# Patient Record
Sex: Male | Born: 1970 | Race: Black or African American | Hispanic: No | Marital: Married | Smoking: Never smoker
Health system: Southern US, Community
[De-identification: ages and names within clinical notes are randomized; demographics above are authoritative.]

## PROBLEM LIST (undated history)

## (undated) ENCOUNTER — Ambulatory Visit (HOSPITAL_COMMUNITY): Admission: EM | Payer: BLUE CROSS/BLUE SHIELD

## (undated) DIAGNOSIS — S2249XA Multiple fractures of ribs, unspecified side, initial encounter for closed fracture: Secondary | ICD-10-CM

## (undated) DIAGNOSIS — T7840XA Allergy, unspecified, initial encounter: Secondary | ICD-10-CM

## (undated) HISTORY — PX: ORTHOPEDIC SURGERY: SHX850

## (undated) HISTORY — DX: Multiple fractures of ribs, unspecified side, initial encounter for closed fracture: S22.49XA

## (undated) HISTORY — DX: Allergy, unspecified, initial encounter: T78.40XA

## (undated) HISTORY — PX: KNEE SURGERY: SHX244

## (undated) HISTORY — PX: OTHER SURGICAL HISTORY: SHX169

---

## 1998-08-15 ENCOUNTER — Emergency Department (HOSPITAL_COMMUNITY): Admission: EM | Admit: 1998-08-15 | Discharge: 1998-08-15 | Payer: Self-pay | Admitting: Emergency Medicine

## 2004-04-12 ENCOUNTER — Emergency Department (HOSPITAL_COMMUNITY): Admission: EM | Admit: 2004-04-12 | Discharge: 2004-04-12 | Payer: Self-pay | Admitting: Family Medicine

## 2006-07-19 ENCOUNTER — Emergency Department (HOSPITAL_COMMUNITY): Admission: EM | Admit: 2006-07-19 | Discharge: 2006-07-19 | Payer: Self-pay | Admitting: Emergency Medicine

## 2006-07-20 ENCOUNTER — Ambulatory Visit (HOSPITAL_COMMUNITY): Admission: RE | Admit: 2006-07-20 | Discharge: 2006-07-20 | Payer: Self-pay | Admitting: Otolaryngology

## 2007-12-26 ENCOUNTER — Emergency Department (HOSPITAL_COMMUNITY): Admission: EM | Admit: 2007-12-26 | Discharge: 2007-12-26 | Payer: Self-pay | Admitting: Emergency Medicine

## 2008-05-18 ENCOUNTER — Emergency Department (HOSPITAL_COMMUNITY): Admission: EM | Admit: 2008-05-18 | Discharge: 2008-05-18 | Payer: Self-pay | Admitting: Emergency Medicine

## 2010-08-10 LAB — BASIC METABOLIC PANEL
Calcium: 8.6 mg/dL (ref 8.4–10.5)
GFR calc Af Amer: >60 mL/min (ref >60)
GFR calc non Af Amer: >60 mL/min (ref >60)
Sodium: 138 mEq/L (ref 135–145)

## 2010-08-10 LAB — CBC
Hemoglobin: 14 g/dL (ref 13.0–17.0)
RBC: 4.36 MIL/uL (ref 4.22–5.81)

## 2010-08-10 LAB — DIFFERENTIAL
Basophils Absolute: 0 10*3/uL (ref 0.0–0.1)
Lymphocytes Relative: 45 % (ref 12–46)
Monocytes Absolute: 0.8 10*3/uL (ref 0.1–1.0)
Monocytes Relative: 9 % (ref 3–12)
Neutro Abs: 3.6 10*3/uL (ref 1.7–7.7)

## 2010-08-10 LAB — SAMPLE TO BLOOD BANK

## 2010-08-10 LAB — ETHANOL: Alcohol, Ethyl (B): 210 mg/dL — ABNORMAL HIGH (ref 0–10)
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
## 2015-04-29 ENCOUNTER — Emergency Department (HOSPITAL_COMMUNITY)
Admission: EM | Admit: 2015-04-29 | Discharge: 2015-04-29 | Disposition: A | Discharge status: Home / Self Care | Payer: Self-pay | Attending: Emergency Medicine | Admitting: Emergency Medicine

## 2015-04-29 ENCOUNTER — Encounter (HOSPITAL_COMMUNITY): Payer: Self-pay | Admitting: Emergency Medicine

## 2015-04-29 DIAGNOSIS — Y92810 Car as the place of occurrence of the external cause: Secondary | ICD-10-CM | POA: Insufficient documentation

## 2015-04-29 DIAGNOSIS — S31119A Laceration without foreign body of abdominal wall, unspecified quadrant without penetration into peritoneal cavity, initial encounter: Secondary | ICD-10-CM | POA: Insufficient documentation

## 2015-04-29 DIAGNOSIS — Z87891 Personal history of nicotine dependence: Secondary | ICD-10-CM | POA: Insufficient documentation

## 2015-04-29 DIAGNOSIS — R000 Tachycardia, unspecified: Secondary | ICD-10-CM | POA: Insufficient documentation

## 2015-04-29 DIAGNOSIS — Y9389 Activity, other specified: Secondary | ICD-10-CM | POA: Insufficient documentation

## 2015-04-29 DIAGNOSIS — Z23 Encounter for immunization: Secondary | ICD-10-CM | POA: Insufficient documentation

## 2015-04-29 DIAGNOSIS — Y998 Other external cause status: Secondary | ICD-10-CM | POA: Insufficient documentation

## 2015-04-29 LAB — PREPARE FRESH FROZEN PLASMA
UNIT DIVISION: 0
UNIT DIVISION: 0

## 2015-04-29 MED ORDER — TETANUS-DIPHTH-ACELL PERTUSSIS 5-2.5-18.5 LF-MCG/0.5 IM SUSP
INTRAMUSCULAR | Status: DC
Start: 2015-04-29 — End: 2015-04-30
  Filled 2015-04-29: qty 0.5

## 2015-04-29 MED ORDER — MORPHINE SULFATE (PF) 2 MG/ML IV SOLN
INTRAVENOUS | Status: AC
  Administered 2015-04-29: 6 mg via INTRAVENOUS
  Filled 2015-04-29: qty 3

## 2015-04-29 MED ORDER — TETANUS-DIPHTH-ACELL PERTUSSIS 5-2.5-18.5 LF-MCG/0.5 IM SUSP
0.5000 mL | Freq: Once | INTRAMUSCULAR | Status: AC
  Administered 2015-04-29: 0.5 mL via INTRAMUSCULAR

## 2015-04-29 MED ORDER — SODIUM CHLORIDE 0.9 % IV SOLN
INTRAVENOUS | Status: AC | PRN
  Administered 2015-04-29: 1000 mL via INTRAVENOUS

## 2015-04-29 MED ORDER — LIDOCAINE-EPINEPHRINE (PF) 2 %-1:200000 IJ SOLN
10.0000 mL | Freq: Once | INTRAMUSCULAR | Status: AC
  Administered 2015-04-29: 10 mL
  Filled 2015-04-29: qty 10

## 2015-04-29 NOTE — ED Notes (Signed)
EDP at the bedside suturing wound.  
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
## 2015-04-29 NOTE — Discharge Instructions (Signed)
Wound Care °Taking care of your wound properly can help to prevent pain and infection. It can also help your wound to heal more quickly.  °HOW TO CARE FOR YOUR WOUND  °· Take or apply over-the-counter and prescription medicines only as told by your health care provider. °· If you were prescribed antibiotic medicine, take or apply it as told by your health care provider. Do not stop using the antibiotic even if your condition improves. °· Clean the wound each day or as told by your health care provider. °¨ Wash the wound with mild soap and water. °¨ Rinse the wound with water to remove all soap. °¨ Pat the wound dry with a clean towel. Do not rub it. °· There are many different ways to close and cover a wound. For example, a wound can be covered with stitches (sutures), skin glue, or adhesive strips. Follow instructions from your health care provider about: °¨ How to take care of your wound. °¨ When and how you should change your bandage (dressing). °¨ When you should remove your dressing. °¨ Removing whatever was used to close your wound. °· Check your wound every day for signs of infection. Watch for: °¨ Redness, swelling, or pain. °¨ Fluid, blood, or pus. °· Keep the dressing dry until your health care provider says it can be removed. Do not take baths, swim, use a hot tub, or do anything that would put your wound underwater until your health care provider approves. °· Raise (elevate) the injured area above the level of your heart while you are sitting or lying down. °· Do not scratch or pick at the wound. °· Keep all follow-up visits as told by your health care provider. This is important. °SEEK MEDICAL CARE IF: °· You received a tetanus shot and you have swelling, severe pain, redness, or bleeding at the injection site. °· You have a fever. °· Your pain is not controlled with medicine. °· You have increased redness, swelling, or pain at the site of your wound. °· You have fluid, blood, or pus coming from your  wound. °· You notice a bad smell coming from your wound or your dressing. °SEEK IMMEDIATE MEDICAL CARE IF: °· You have a red streak going away from your wound. °  °This information is not intended to replace advice given to you by your health care provider. Make sure you discuss any questions you have with your health care provider. °  °Document Released: 01/20/2008 Document Revised: 08/27/2014 Document Reviewed: 04/08/2014 °Elsevier Interactive Patient Education ©2016 [employer] ° °

## 2015-04-30 LAB — TYPE AND SCREEN
ABO/RH(D): O POS
ANTIBODY SCREEN: POSITIVE
DAT, IGG: NEGATIVE
PT AG Type: NEGATIVE
UNIT DIVISION: 0
Unit division: 0

## 2015-05-05 ENCOUNTER — Encounter (HOSPITAL_COMMUNITY): Payer: Self-pay | Admitting: *Deleted

## 2015-05-05 ENCOUNTER — Emergency Department (INDEPENDENT_AMBULATORY_CARE_PROVIDER_SITE_OTHER)
Admission: EM | Admit: 2015-05-05 | Discharge: 2015-05-05 | Disposition: A | Discharge status: Home / Self Care | Payer: BLUE CROSS/BLUE SHIELD | Source: Home / Self Care | Attending: Family Medicine | Admitting: Family Medicine

## 2015-05-05 DIAGNOSIS — Z5189 Encounter for other specified aftercare: Secondary | ICD-10-CM
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
## 2015-05-05 NOTE — Discharge Instructions (Signed)
Apply moist heat to your abdomen  Follow up as directed for suture removal.  Return if there are new or worsening of symptoms
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
## 2015-10-28 ENCOUNTER — Encounter (HOSPITAL_COMMUNITY): Payer: Self-pay | Admitting: Emergency Medicine

## 2015-10-28 ENCOUNTER — Ambulatory Visit (HOSPITAL_COMMUNITY)
Admission: EM | Admit: 2015-10-28 | Discharge: 2015-10-28 | Disposition: A | Discharge status: Home / Self Care | Payer: BLUE CROSS/BLUE SHIELD | Attending: Emergency Medicine | Admitting: Emergency Medicine

## 2015-10-28 DIAGNOSIS — S90851A Superficial foreign body, right foot, initial encounter: Secondary | ICD-10-CM | POA: Diagnosis not present

## 2015-10-28 DIAGNOSIS — L089 Local infection of the skin and subcutaneous tissue, unspecified: Secondary | ICD-10-CM | POA: Diagnosis not present

## 2015-10-28 DIAGNOSIS — Z23 Encounter for immunization: Secondary | ICD-10-CM

## 2015-10-28 MED ORDER — TETANUS-DIPHTH-ACELL PERTUSSIS 5-2.5-18.5 LF-MCG/0.5 IM SUSP
INTRAMUSCULAR | Status: AC
  Filled 2015-10-28: qty 0.5

## 2015-10-28 MED ORDER — CLINDAMYCIN HCL 300 MG PO CAPS: 300.0000 mg | ORAL_CAPSULE | Freq: Three times a day (TID) | ORAL | Status: DC

## 2015-10-28 MED ORDER — LIDOCAINE HCL (PF) 2 % IJ SOLN
INTRAMUSCULAR | Status: AC
  Filled 2015-10-28: qty 2

## 2015-10-28 MED ORDER — TETANUS-DIPHTH-ACELL PERTUSSIS 5-2.5-18.5 LF-MCG/0.5 IM SUSP
0.5000 mL | Freq: Once | INTRAMUSCULAR | Status: AC
  Administered 2015-10-28: 0.5 mL via INTRAMUSCULAR

## 2015-10-28 NOTE — Discharge Instructions (Signed)
I removed the piece of metal from your foot. There is some infection. Take clindamycin 3 times a day for 10 days. Soak the foot in warm soapy water 2-3 times a day for the next 3 days. Stay off of the foot for the next 24 hours. Take Tylenol or ibuprofen as needed for pain. Follow-up as needed.
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
## 2015-10-28 NOTE — ED Notes (Signed)
The patient presented to the [facility] with a complaint of a possible foreign body in his right foot for 1 week. 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

## 2016-03-30 ENCOUNTER — Encounter (HOSPITAL_COMMUNITY): Payer: Self-pay | Admitting: Emergency Medicine

## 2016-03-30 ENCOUNTER — Ambulatory Visit (HOSPITAL_COMMUNITY)
Admission: EM | Admit: 2016-03-30 | Discharge: 2016-03-30 | Disposition: A | Discharge status: Home / Self Care | Payer: BLUE CROSS/BLUE SHIELD | Attending: Family Medicine | Admitting: Family Medicine

## 2016-03-30 DIAGNOSIS — K2100 Gastro-esophageal reflux disease with esophagitis, without bleeding: Secondary | ICD-10-CM

## 2016-03-30 DIAGNOSIS — R0789 Other chest pain: Secondary | ICD-10-CM | POA: Diagnosis not present

## 2016-03-30 MED ORDER — OMEPRAZOLE 20 MG PO CPDR: 20.0000 mg | DELAYED_RELEASE_CAPSULE | Freq: Every day | ORAL | 0 refills | Status: DC
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
## 2016-03-30 NOTE — ED Triage Notes (Signed)
Pt has been experiencing atypical left sided chest pain that radiates to his back about twice a month for the last two months. He describes the pain as a burning sensation that will last 5-10 minutes.  He reports no other associated symptoms.

## 2018-04-17 ENCOUNTER — Other Ambulatory Visit: Payer: Self-pay

## 2018-04-17 ENCOUNTER — Ambulatory Visit (HOSPITAL_COMMUNITY)
Admission: EM | Admit: 2018-04-17 | Discharge: 2018-04-17 | Disposition: A | Discharge status: Home / Self Care | Payer: BLUE CROSS/BLUE SHIELD

## 2018-04-17 ENCOUNTER — Encounter (HOSPITAL_COMMUNITY): Payer: Self-pay

## 2018-04-17 DIAGNOSIS — K529 Noninfective gastroenteritis and colitis, unspecified: Secondary | ICD-10-CM | POA: Diagnosis not present

## 2018-04-17 NOTE — ED Triage Notes (Signed)
Pt cc vomiting and headaches and fever this started yesterday.
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
## 2018-04-17 NOTE — Discharge Instructions (Signed)
I am glad that you are feeling better This was most likely a viral illness that is resolving Make sure you stay hydrated You can continue the Mucinex as needed for cough and congestion Follow up as needed for continued or worsening symptoms

## 2018-12-10 ENCOUNTER — Ambulatory Visit (HOSPITAL_COMMUNITY)
Admission: EM | Admit: 2018-12-10 | Discharge: 2018-12-10 | Disposition: A | Discharge status: Home / Self Care | Payer: BC Managed Care – PPO | Attending: Urgent Care | Admitting: Urgent Care

## 2018-12-10 ENCOUNTER — Other Ambulatory Visit: Payer: Self-pay

## 2018-12-10 ENCOUNTER — Ambulatory Visit (INDEPENDENT_AMBULATORY_CARE_PROVIDER_SITE_OTHER): Payer: BC Managed Care – PPO

## 2018-12-10 ENCOUNTER — Encounter (HOSPITAL_COMMUNITY): Payer: Self-pay | Admitting: Urgent Care

## 2018-12-10 DIAGNOSIS — T148XXA Other injury of unspecified body region, initial encounter: Secondary | ICD-10-CM

## 2018-12-10 DIAGNOSIS — S20211A Contusion of right front wall of thorax, initial encounter: Secondary | ICD-10-CM

## 2018-12-10 DIAGNOSIS — R0781 Pleurodynia: Secondary | ICD-10-CM

## 2018-12-10 DIAGNOSIS — S2241XA Multiple fractures of ribs, right side, initial encounter for closed fracture: Secondary | ICD-10-CM

## 2018-12-10 DIAGNOSIS — S300XXA Contusion of lower back and pelvis, initial encounter: Secondary | ICD-10-CM | POA: Diagnosis not present

## 2018-12-10 DIAGNOSIS — W19XXXA Unspecified fall, initial encounter: Secondary | ICD-10-CM | POA: Diagnosis not present

## 2018-12-10 MED ORDER — MELOXICAM 7.5 MG PO TABS: 7.5000 mg | ORAL_TABLET | Freq: Every day | ORAL | 0 refills | Status: DC

## 2018-12-10 MED ORDER — CYCLOBENZAPRINE HCL 5 MG PO TABS: 5.0000 mg | ORAL_TABLET | Freq: Three times a day (TID) | ORAL | 0 refills | Status: DC | PRN

## 2018-12-10 NOTE — ED Triage Notes (Signed)
Pt presents with right hip pain after a fall yesterday and landing on it.
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
## 2018-12-12 ENCOUNTER — Encounter: Payer: Self-pay | Admitting: Family Medicine

## 2018-12-12 ENCOUNTER — Ambulatory Visit (INDEPENDENT_AMBULATORY_CARE_PROVIDER_SITE_OTHER): Payer: BC Managed Care – PPO | Admitting: Family Medicine

## 2018-12-12 DIAGNOSIS — S2241XA Multiple fractures of ribs, right side, initial encounter for closed fracture: Secondary | ICD-10-CM
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
## 2018-12-12 NOTE — Patient Instructions (Addendum)
   Vitamin D3:  5,000 IU daily  Vitamin K2:  100 mcg daily  Magnesium:  200-400 mg daily    

## 2018-12-12 NOTE — Progress Notes (Signed)
I saw and examined the patient with Dr. Mayer Masker and agree with assessment and plan as outlined.    Right sided rib fractures 3 days ago after falling on stairs.  Will treat with ACE wrap, flexeril as needed.  Out of work 4 weeks.  Return in 3-4 for recheck and x-rays.

## 2018-12-21 ENCOUNTER — Telehealth: Payer: Self-pay | Admitting: Family Medicine

## 2018-12-21 MED ORDER — TRAMADOL HCL 50 MG PO TABS: 50.0000 mg | ORAL_TABLET | Freq: Three times a day (TID) | ORAL | 0 refills | Status: DC | PRN

## 2018-12-21 MED ORDER — CYCLOBENZAPRINE HCL 5 MG PO TABS: 5.0000 mg | ORAL_TABLET | Freq: Three times a day (TID) | ORAL | 1 refills | Status: DC | PRN

## 2018-12-21 NOTE — Telephone Encounter (Signed)
Please advise (taking an NSAID & muscle relaxer for pain).

## 2018-12-21 NOTE — Telephone Encounter (Signed)
Done

## 2018-12-21 NOTE — Addendum Note (Signed)
Addended by: Hortencia Pilar on: 12/21/2018 11:58 AM   Modules accepted: Orders

## 2018-12-21 NOTE — Telephone Encounter (Signed)
Patient called requesting an RX refill on his pain medication.  CB#[phone].  Thank you.

## 2018-12-21 NOTE — Telephone Encounter (Signed)
I called the patient and advised him of the Rx for tramadol. He would also like a refill on the cyclobenzaprine.

## 2018-12-21 NOTE — Telephone Encounter (Signed)
Rx sent 

## 2019-01-02 ENCOUNTER — Ambulatory Visit (INDEPENDENT_AMBULATORY_CARE_PROVIDER_SITE_OTHER): Payer: BC Managed Care – PPO | Admitting: Family Medicine

## 2019-01-02 ENCOUNTER — Encounter: Payer: Self-pay | Admitting: Family Medicine

## 2019-01-02 DIAGNOSIS — S2241XD Multiple fractures of ribs, right side, subsequent encounter for fracture with routine healing: Secondary | ICD-10-CM

## 2019-01-02 MED ORDER — TRAMADOL HCL 50 MG PO TABS: 50.0000 mg | ORAL_TABLET | Freq: Every evening | ORAL | 0 refills | Status: DC | PRN

## 2019-01-02 MED ORDER — MELOXICAM 7.5 MG PO TABS: 7.5000 mg | ORAL_TABLET | Freq: Every day | ORAL | 2 refills | Status: DC
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
## 2019-01-04 ENCOUNTER — Telehealth: Payer: Self-pay | Admitting: Family Medicine

## 2019-01-04 NOTE — Telephone Encounter (Signed)
Patient came into the clinic and advised that [facility] needs update office notes and treatment plan.  The person to contact is Dru Shawn Stall at [phone].  Thank you.

## 2019-01-05 NOTE — Telephone Encounter (Signed)
Holding for Dr Junius Roads.

## 2019-01-09 NOTE — Telephone Encounter (Signed)
Faxed to [phone]

## 2019-01-09 NOTE — Telephone Encounter (Signed)
Will you fax the 9/08 office note and work note to [place]? Fax [phone], to the attention of the person and extension in the original message. Thanks.

## 2019-01-23 ENCOUNTER — Ambulatory Visit (INDEPENDENT_AMBULATORY_CARE_PROVIDER_SITE_OTHER): Payer: BC Managed Care – PPO | Admitting: Family Medicine

## 2019-01-23 ENCOUNTER — Ambulatory Visit: Payer: Self-pay

## 2019-01-23 ENCOUNTER — Encounter: Payer: Self-pay | Admitting: Family Medicine

## 2019-01-23 DIAGNOSIS — S2241XD Multiple fractures of ribs, right side, subsequent encounter for fracture with routine healing: Secondary | ICD-10-CM
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
## 2019-01-25 ENCOUNTER — Telehealth: Payer: Self-pay | Admitting: Family Medicine

## 2019-01-25 NOTE — Telephone Encounter (Signed)
Patient came into the office stating that Richard Shaffer still needs the detailed office notes faxed to them at [phone].  Thank you.

## 2019-01-25 NOTE — Telephone Encounter (Signed)
Think he is talking about [employer] because we are not filing work comp for him

## 2019-01-26 NOTE — Telephone Encounter (Signed)
I guess this should go to you?

## 2019-01-26 NOTE — Telephone Encounter (Signed)
Ov notes faxed to [facility] disability [phone]

## 2019-01-26 NOTE — Telephone Encounter (Signed)
Patient came into the office stating that Richard Shaffer still needs the detailed office notes faxed to them at [phone].  Thank you.

## 2019-02-12 ENCOUNTER — Ambulatory Visit (INDEPENDENT_AMBULATORY_CARE_PROVIDER_SITE_OTHER): Payer: BC Managed Care – PPO | Admitting: Family Medicine

## 2019-02-12 ENCOUNTER — Encounter: Payer: Self-pay | Admitting: Family Medicine

## 2019-02-12 ENCOUNTER — Other Ambulatory Visit: Payer: Self-pay

## 2019-02-12 DIAGNOSIS — S2241XD Multiple fractures of ribs, right side, subsequent encounter for fracture with routine healing: Secondary | ICD-10-CM
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
## 2019-03-26 ENCOUNTER — Other Ambulatory Visit: Payer: Self-pay | Admitting: Family Medicine

## 2019-03-26 MED ORDER — MELOXICAM 7.5 MG PO TABS: 7.5000 mg | ORAL_TABLET | Freq: Every day | ORAL | 2 refills | Status: DC

## 2019-06-22 ENCOUNTER — Ambulatory Visit
Admission: EM | Admit: 2019-06-22 | Discharge: 2019-06-22 | Disposition: A | Discharge status: Home / Self Care | Payer: BC Managed Care – PPO | Attending: Emergency Medicine | Admitting: Emergency Medicine

## 2019-06-22 DIAGNOSIS — M62830 Muscle spasm of back: Secondary | ICD-10-CM | POA: Diagnosis not present

## 2019-06-22 MED ORDER — METHYLPREDNISOLONE SODIUM SUCC 125 MG IJ SOLR
80.0000 mg | Freq: Once | INTRAMUSCULAR | Status: AC
  Administered 2019-06-22: 80 mg via INTRAMUSCULAR

## 2019-06-22 MED ORDER — CYCLOBENZAPRINE HCL 5 MG PO TABS: 5.0000 mg | ORAL_TABLET | Freq: Two times a day (BID) | ORAL | 0 refills | Status: AC | PRN
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
## 2019-06-22 NOTE — Discharge Instructions (Addendum)
Recommend RICE: rest, ice, compression, elevation as needed for pain.    Heat therapy (hot compress, warm wash red, hot showers, etc.) can help relax muscles and soothe muscle aches. Cold therapy (ice packs) can be used to help swelling both after injury and after prolonged use of areas of chronic pain/aches.  For pain: recommend 350 mg-1000 mg of Tylenol (acetaminophen) and/or 200 mg - 800 mg of Advil (ibuprofen, Motrin) every 8 hours as needed.  May alternate between the two throughout the day as they are generally safe to take together.  DO NOT exceed more than 3000 mg of Tylenol or 3200 mg of ibuprofen in a 24 hour period as this could damage your stomach, kidneys, liver, or increase your bleeding risk.  May take muscle relaxer as needed for severe pain / spasm.  (This medication may cause you to become tired so it is important you do not drink alcohol or operate heavy machinery while on this medication.  Recommend your first dose to be taken before bedtime to monitor for side effects safely)

## 2019-06-22 NOTE — ED Triage Notes (Signed)
Pt c/o lt mid back/flank pain since yesterday. States feels like a muscle strain, pain worse on movement. Denies pain radiating. Denies urinary problems.

## 2019-08-02 ENCOUNTER — Other Ambulatory Visit: Payer: Self-pay | Admitting: Radiology

## 2019-08-02 MED ORDER — CYCLOBENZAPRINE HCL 5 MG PO TABS
5.0000 mg | ORAL_TABLET | Freq: Three times a day (TID) | ORAL | 0 refills | Status: DC | PRN
Start: 2019-08-02 — End: 2020-02-12

## 2019-11-21 ENCOUNTER — Encounter: Payer: Self-pay | Admitting: Gastroenterology

## 2020-01-11 ENCOUNTER — Ambulatory Visit (AMBULATORY_SURGERY_CENTER): Payer: Self-pay | Admitting: *Deleted

## 2020-01-11 ENCOUNTER — Other Ambulatory Visit: Payer: Self-pay

## 2020-01-11 VITALS — Ht 74.5 in | Wt 196.0 lb

## 2020-01-11 DIAGNOSIS — Z1211 Encounter for screening for malignant neoplasm of colon: Secondary | ICD-10-CM

## 2020-01-11 MED ORDER — PEG 3350-KCL-NA BICARB-NACL 420 G PO SOLR: 4000.0000 mL | Freq: Once | ORAL | 0 refills | Status: AC

## 2020-01-11 NOTE — Progress Notes (Signed)
COV VAX X 2  No egg or soy allergy known to patient  No issues with past sedation with any surgeries or procedures no intubation problems in the past  No FH of Malignant Hyperthermia No diet pills per patient No home 02 use per patient  No blood thinners per patient  Pt denies issues with constipation  No A fib or A flutter  EMMI video to pt or via [place] 19 guidelines implemented in PV today with Pt and RN   PLENVU NOT COVERED BY PT'S [employer]- [place]  Due to the COVID-19 pandemic we are asking patients to follow these guidelines. Please only bring one care partner. Please be aware that your care partner may wait in the car in the parking lot or if they feel like they will be too hot to wait in the car, they may wait in the lobby on the 4th floor. All care partners are required to wear a mask the entire time (we do not have any that we can provide them), they need to practice social distancing, and we will do a Covid check for all patient's and care partners when you arrive. Also we will check their temperature and your temperature. If the care partner waits in their car they need to stay in the parking lot the entire time and we will call them on their cell phone when the patient is ready for discharge so they can bring the car to the front of the building. Also all patient's will need to wear a mask into building.

## 2020-01-14 ENCOUNTER — Encounter: Payer: Self-pay | Admitting: Gastroenterology

## 2020-01-24 ENCOUNTER — Other Ambulatory Visit: Payer: Self-pay

## 2020-01-24 ENCOUNTER — Ambulatory Visit (AMBULATORY_SURGERY_CENTER): Payer: BC Managed Care – PPO | Admitting: Gastroenterology

## 2020-01-24 ENCOUNTER — Encounter: Payer: Self-pay | Admitting: Gastroenterology

## 2020-01-24 VITALS — BP 113/78 | HR 67 | Temp 98.7°F | Resp 18 | Ht 74.0 in | Wt 196.0 lb

## 2020-01-24 DIAGNOSIS — K635 Polyp of colon: Secondary | ICD-10-CM | POA: Diagnosis not present

## 2020-01-24 DIAGNOSIS — Z1211 Encounter for screening for malignant neoplasm of colon: Secondary | ICD-10-CM

## 2020-01-24 DIAGNOSIS — D122 Benign neoplasm of ascending colon: Secondary | ICD-10-CM

## 2020-01-24 MED ORDER — SODIUM CHLORIDE 0.9 % IV SOLN: 500.0000 mL | Freq: Once | INTRAVENOUS | Status: DC

## 2020-01-24 NOTE — Patient Instructions (Signed)
YOU HAD AN ENDOSCOPIC PROCEDURE TODAY AT THE [facility]:   Refer to the procedure report that was given to you for any specific questions about what was found during the examination.  If the procedure report does not answer your questions, please call your gastroenterologist to clarify.  If you requested that your care partner not be given the details of your procedure findings, then the procedure report has been included in a sealed envelope for you to review at your convenience later.  YOU SHOULD EXPECT: Some feelings of bloating in the abdomen. Passage of more gas than usual.  Walking can help get rid of the air that was put into your GI tract during the procedure and reduce the bloating. If you had a lower endoscopy (such as a colonoscopy or flexible sigmoidoscopy) you may notice spotting of blood in your stool or on the toilet paper. If you underwent a bowel prep for your procedure, you may not have a normal bowel movement for a few days.  Please Note:  You might notice some irritation and congestion in your nose or some drainage.  This is from the oxygen used during your procedure.  There is no need for concern and it should clear up in a day or so.  SYMPTOMS TO REPORT IMMEDIATELY:   Following lower endoscopy (colonoscopy or flexible sigmoidoscopy):  Excessive amounts of blood in the stool  Significant tenderness or worsening of abdominal pains  Swelling of the abdomen that is new, acute  Fever of 100F or higher  For urgent or emergent issues, a gastroenterologist can be reached at any hour by calling [phone]. Do not use MyChart messaging for urgent concerns.    DIET:  We do recommend a small meal at first, but then you may proceed to your regular diet.  Drink plenty of fluids but you should avoid alcoholic beverages for 24 hours.  ACTIVITY:  You should plan to take it easy for the rest of today and you should NOT DRIVE or use heavy machinery until tomorrow (because  of the sedation medicines used during the test).    FOLLOW UP: Our staff will call the number listed on your records 48-72 hours following your procedure to check on you and address any questions or concerns that you may have regarding the information given to you following your procedure. If we do not reach you, we will leave a message.  We will attempt to reach you two times.  During this call, we will ask if you have developed any symptoms of COVID 19. If you develop any symptoms (ie: fever, flu-like symptoms, shortness of breath, cough etc.) before then, please call [phone].  If you test positive for Covid 19 in the 2 weeks post procedure, please call and report this information to us.    If any biopsies were taken you will be contacted by phone or by letter within the next 1-3 weeks.  Please call us at [phone] if you have not heard about the biopsies in 3 weeks.    SIGNATURES/CONFIDENTIALITY: You and/or your care partner have signed paperwork which will be entered into your electronic medical record.  These signatures attest to the fact that that the information above on your After Visit Summary has been reviewed and is understood.  Full responsibility of the confidentiality of this discharge information lies with you and/or your care-partner. 

## 2020-01-24 NOTE — Op Note (Signed)
[place] Patient Name: Richard Shaffer Procedure Date: 01/24/2020 1:58 PM MRN: [id number] Endoscopist: Mallie Mussel L. Loletha Shaffer , MD Age: 49 Referring MD:  Date of Birth: [birth year] Gender: Male Account #: [id number] Procedure:                Colonoscopy Indications:              Screening for colorectal malignant neoplasm, This                            is the patient's first colonoscopy Medicines:                Monitored Anesthesia Care Procedure:                Pre-Anesthesia Assessment:                           - Prior to the procedure, a History and Physical                            was performed, and patient medications and                            allergies were reviewed. The patient's tolerance of                            previous anesthesia was also reviewed. The risks                            and benefits of the procedure and the sedation                            options and risks were discussed with the patient.                            All questions were answered, and informed consent                            was obtained. Prior Anticoagulants: The patient has                            taken no previous anticoagulant or antiplatelet                            agents. ASA Grade Assessment: I - A normal, healthy                            patient. After reviewing the risks and benefits,                            the patient was deemed in satisfactory condition to                            undergo the procedure.  After obtaining informed consent, the colonoscope                            was passed under direct vision. Throughout the                            procedure, the patient's blood pressure, pulse, and                            oxygen saturations were monitored continuously. The                            Colonoscope was introduced through the anus and                            advanced to the the cecum,  identified by                            appendiceal orifice and ileocecal valve. The                            colonoscopy was performed without difficulty. The                            patient tolerated the procedure well. The quality                            of the bowel preparation was excellent. The                            ileocecal valve, appendiceal orifice, and rectum                            were photographed. The bowel preparation used was                            Plenvu. Scope In: 2:12:14 PM Scope Out: 2:26:41 PM Scope Withdrawal Time: 0 hours 10 minutes 28 seconds  Total Procedure Duration: 0 hours 14 minutes 27 seconds  Findings:                 The perianal and digital rectal examinations were                            normal.                           A diminutive polyp was found in the ascending                            colon. The polyp was flat. The polyp was removed                            with a cold biopsy forceps. Resection and retrieval  were complete.                           Multiple diverticula were found from rectum to                            hepatic flexure.                           The exam was otherwise without abnormality on                            direct and retroflexion views. Complications:            No immediate complications. Estimated Blood Loss:     Estimated blood loss was minimal. Impression:               - One diminutive polyp in the ascending colon,                            removed with a cold biopsy forceps. Resected and                            retrieved.                           - Diverticulosis from rectum to hepatic flexure.                           - The examination was otherwise normal on direct                            and retroflexion views. Recommendation:           - Patient has a contact number available for                            emergencies. The signs and symptoms of  potential                            delayed complications were discussed with the                            patient. Return to normal activities tomorrow.                            Written discharge instructions were provided to the                            patient.                           - Resume previous diet.                           - Continue present medications.                           -  Await pathology results.                           - Repeat colonoscopy is recommended for                            surveillance. The colonoscopy date will be                            determined after pathology results from today's                            exam become available for review. Richard Reber [name], MD 01/24/2020 2:32:08 PM This report has been signed electronically.

## 2020-01-24 NOTE — Progress Notes (Signed)
To PACU, VSS. Report to [email or web address] 

## 2020-01-24 NOTE — Progress Notes (Signed)
Called to room to assist during endoscopic procedure.  Patient ID and intended procedure confirmed with present staff. Received instructions for my participation in the procedure from the performing physician.  

## 2020-01-24 NOTE — Progress Notes (Signed)
Pt's states no medical or surgical changes since previsit or office visit. 

## 2020-01-24 NOTE — Progress Notes (Signed)
C.W. vital signs. 

## 2020-01-28 ENCOUNTER — Telehealth: Payer: Self-pay

## 2020-01-28 ENCOUNTER — Telehealth: Payer: Self-pay | Admitting: *Deleted

## 2020-01-28 NOTE — Telephone Encounter (Signed)
Left message for patient to call with questions or concerns. No answer for post procedure follow up call.

## 2020-01-28 NOTE — Telephone Encounter (Signed)
LVM

## 2020-02-02 ENCOUNTER — Encounter: Payer: Self-pay | Admitting: Gastroenterology

## 2020-02-12 ENCOUNTER — Ambulatory Visit
Admission: EM | Admit: 2020-02-12 | Discharge: 2020-02-12 | Disposition: A | Discharge status: Home / Self Care | Payer: BC Managed Care – PPO | Attending: Emergency Medicine | Admitting: Emergency Medicine

## 2020-02-12 ENCOUNTER — Encounter: Payer: Self-pay | Admitting: Emergency Medicine

## 2020-02-12 ENCOUNTER — Other Ambulatory Visit: Payer: Self-pay

## 2020-02-12 DIAGNOSIS — J029 Acute pharyngitis, unspecified: Secondary | ICD-10-CM

## 2020-02-12 DIAGNOSIS — Z20822 Contact with and (suspected) exposure to covid-19: Secondary | ICD-10-CM | POA: Diagnosis present

## 2020-02-12 DIAGNOSIS — R519 Headache, unspecified: Secondary | ICD-10-CM | POA: Diagnosis not present

## 2020-02-12 LAB — POCT RAPID STREP A (OFFICE): Rapid Strep A Screen: NEGATIVE

## 2020-02-12 MED ORDER — ACETAMINOPHEN 325 MG PO TABS
650.0000 mg | ORAL_TABLET | Freq: Once | ORAL | Status: AC
  Administered 2020-02-12: 650 mg via ORAL

## 2020-02-12 NOTE — Discharge Instructions (Addendum)
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
## 2020-02-12 NOTE — ED Triage Notes (Signed)
Pt sts fever, body aches and chills starting last night and became worse today; pt sts sore throat

## 2020-02-13 LAB — SARS-COV-2, NAA 2 DAY TAT

## 2020-02-13 LAB — NOVEL CORONAVIRUS, NAA: SARS-CoV-2, NAA: NOT DETECTED

## 2020-02-16 LAB — CULTURE, GROUP A STREP (THRC)

## 2020-02-21 ENCOUNTER — Ambulatory Visit: Payer: BC Managed Care – PPO | Admitting: Family Medicine

## 2020-12-01 ENCOUNTER — Encounter (HOSPITAL_COMMUNITY): Payer: Self-pay

## 2020-12-01 ENCOUNTER — Other Ambulatory Visit: Payer: Self-pay

## 2020-12-01 ENCOUNTER — Ambulatory Visit (HOSPITAL_COMMUNITY)
Admission: EM | Admit: 2020-12-01 | Discharge: 2020-12-01 | Disposition: A | Discharge status: Home / Self Care | Payer: BC Managed Care – PPO | Attending: Physician Assistant | Admitting: Physician Assistant

## 2020-12-01 DIAGNOSIS — Z20822 Contact with and (suspected) exposure to covid-19: Secondary | ICD-10-CM | POA: Diagnosis not present

## 2020-12-01 DIAGNOSIS — R051 Acute cough: Secondary | ICD-10-CM | POA: Insufficient documentation

## 2020-12-01 DIAGNOSIS — R0789 Other chest pain: Secondary | ICD-10-CM | POA: Insufficient documentation

## 2020-12-01 DIAGNOSIS — J069 Acute upper respiratory infection, unspecified: Secondary | ICD-10-CM

## 2020-12-01 MED ORDER — PROMETHAZINE-DM 6.25-15 MG/5ML PO SYRP: 5.0000 mL | ORAL_SOLUTION | Freq: Two times a day (BID) | ORAL | 0 refills | Status: DC | PRN

## 2020-12-01 MED ORDER — PREDNISONE 20 MG PO TABS: 40.0000 mg | ORAL_TABLET | Freq: Every day | ORAL | 0 refills | Status: DC

## 2020-12-01 NOTE — Discharge Instructions (Addendum)
We will contact you if your COVID is positive.  Start prednisone burst (40 mg for 4 days).  Do not take NSAIDs including aspirin, ibuprofen/Advil, naproxen/Aleve with this medication as it can cause stomach bleeding.  Use Promethazine DM up to twice a day as needed.  This make you sleepy do not drive or drink alcohol taking it.  Use Mucinex and Flonase for additional symptom relief.  Make sure you are drinking plenty of fluid.  If anything worsens please return for reevaluation.
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
## 2020-12-01 NOTE — ED Triage Notes (Signed)
Pt reports cough, nasal congestion x 1 1/2 day. Pt has not taken any medication for complaints.

## 2020-12-02 LAB — SARS CORONAVIRUS 2 (TAT 6-24 HRS): SARS Coronavirus 2: NEGATIVE

## 2020-12-29 ENCOUNTER — Other Ambulatory Visit: Payer: Self-pay

## 2020-12-29 ENCOUNTER — Ambulatory Visit
Admission: EM | Admit: 2020-12-29 | Discharge: 2020-12-29 | Disposition: A | Discharge status: Home / Self Care | Payer: BC Managed Care – PPO | Attending: Internal Medicine | Admitting: Internal Medicine

## 2020-12-29 ENCOUNTER — Encounter: Payer: Self-pay | Admitting: Emergency Medicine

## 2020-12-29 DIAGNOSIS — M546 Pain in thoracic spine: Secondary | ICD-10-CM

## 2020-12-29 DIAGNOSIS — S29019A Strain of muscle and tendon of unspecified wall of thorax, initial encounter: Secondary | ICD-10-CM

## 2020-12-29 MED ORDER — KETOROLAC TROMETHAMINE 60 MG/2ML IM SOLN
30.0000 mg | Freq: Once | INTRAMUSCULAR | Status: AC
  Administered 2020-12-29: 30 mg via INTRAMUSCULAR
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
## 2020-12-29 NOTE — Discharge Instructions (Addendum)
You have suspected muscle strain of your back.  You were given ketorolac injection in urgent care today to help with pain and inflammation.  Please avoid taking any over-the-counter ibuprofen, Advil, Aleve for at least 24 hours following injection.  You may take Tylenol as needed during this 24 hours.  Alternate ice and heat application to affected area of pain and follow-up with PCP or urgent care if pain persist.
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
## 2021-04-06 ENCOUNTER — Encounter (HOSPITAL_BASED_OUTPATIENT_CLINIC_OR_DEPARTMENT_OTHER): Payer: Self-pay | Admitting: Emergency Medicine

## 2021-04-06 ENCOUNTER — Other Ambulatory Visit: Payer: Self-pay

## 2021-04-06 ENCOUNTER — Emergency Department (HOSPITAL_BASED_OUTPATIENT_CLINIC_OR_DEPARTMENT_OTHER)
Admission: EM | Admit: 2021-04-06 | Discharge: 2021-04-06 | Disposition: A | Discharge status: Home / Self Care | Payer: BC Managed Care – PPO | Attending: Emergency Medicine | Admitting: Emergency Medicine

## 2021-04-06 DIAGNOSIS — J069 Acute upper respiratory infection, unspecified: Secondary | ICD-10-CM | POA: Insufficient documentation

## 2021-04-06 DIAGNOSIS — R059 Cough, unspecified: Secondary | ICD-10-CM | POA: Diagnosis present

## 2021-04-06 DIAGNOSIS — Z20822 Contact with and (suspected) exposure to covid-19: Secondary | ICD-10-CM | POA: Diagnosis not present

## 2021-04-06 LAB — RESP PANEL BY RT-PCR (FLU A&B, COVID) ARPGX2
Influenza A by PCR: NEGATIVE
Influenza B by PCR: NEGATIVE
SARS Coronavirus 2 by RT PCR: NEGATIVE

## 2021-04-06 NOTE — ED Triage Notes (Signed)
Pt presents with family . All have  colds and sniffles x 3 -4 days
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
## 2021-08-25 ENCOUNTER — Other Ambulatory Visit: Payer: Self-pay

## 2021-08-25 ENCOUNTER — Encounter (HOSPITAL_BASED_OUTPATIENT_CLINIC_OR_DEPARTMENT_OTHER): Payer: Self-pay

## 2021-08-25 DIAGNOSIS — R200 Anesthesia of skin: Secondary | ICD-10-CM | POA: Insufficient documentation

## 2021-08-25 LAB — CBC
HCT: 43.7 % (ref 39.0–52.0)
Hemoglobin: 14.5 g/dL (ref 13.0–17.0)
MCH: 31.3 pg (ref 26.0–34.0)
MCHC: 33.2 g/dL (ref 30.0–36.0)
MCV: 94.2 fL (ref 80.0–100.0)
Platelets: 289 10*3/uL (ref 150–400)
RBC: 4.64 MIL/uL (ref 4.22–5.81)
RDW: 13.8 % (ref 11.5–15.5)
WBC: 7.9 10*3/uL (ref 4.0–10.5)
nRBC: 0 % (ref 0.0–0.2)

## 2021-08-25 LAB — DIFFERENTIAL
Abs Immature Granulocytes: 0.01 10*3/uL (ref 0.00–0.07)
Basophils Absolute: 0 10*3/uL (ref 0.0–0.1)
Basophils Relative: 0 %
Eosinophils Absolute: 0.2 10*3/uL (ref 0.0–0.5)
Eosinophils Relative: 3 %
Immature Granulocytes: 0 %
Lymphocytes Relative: 31 %
Lymphs Abs: 2.5 10*3/uL (ref 0.7–4.0)
Monocytes Absolute: 0.9 10*3/uL (ref 0.1–1.0)
Monocytes Relative: 11 %
Neutro Abs: 4.3 10*3/uL (ref 1.7–7.7)
Neutrophils Relative %: 55 %

## 2021-08-25 LAB — COMPREHENSIVE METABOLIC PANEL
ALT: 28 U/L (ref 0–44)
AST: 26 U/L (ref 15–41)
Albumin: 4.3 g/dL (ref 3.5–5.0)
Alkaline Phosphatase: 75 U/L (ref 38–126)
Anion gap: 13 (ref 5–15)
BUN: 16 mg/dL (ref 6–20)
CO2: 24 mmol/L (ref 22–32)
Calcium: 9.1 mg/dL (ref 8.9–10.3)
Chloride: 99 mmol/L (ref 98–111)
Creatinine, Ser: 1.15 mg/dL (ref 0.61–1.24)
GFR, Estimated: >60 mL/min (ref >60)
Glucose, Bld: 84 mg/dL (ref 70–99)
Potassium: 3.8 mmol/L (ref 3.5–5.1)
Sodium: 136 mmol/L (ref 135–145)
Total Bilirubin: 0.3 mg/dL (ref 0.3–1.2)
Total Protein: 7.5 g/dL (ref 6.5–8.1)

## 2021-08-25 LAB — PROTIME-INR
INR: 1 (ref 0.8–1.2)
Prothrombin Time: 12.8 seconds (ref 11.4–15.2)

## 2021-08-25 LAB — CBG MONITORING, ED: Glucose-Capillary: 89 mg/dL (ref 70–99)

## 2021-08-25 LAB — APTT: aPTT: 33 seconds (ref 24–36)

## 2021-08-25 NOTE — ED Triage Notes (Signed)
Patient here POV from Home. ? ?Endorses Right Arm Numbness/Tingling that began Saturday. Numbness is Intermittent in [place].  ? ?No Acute Trauma/Injury noted. No other Associated Symptoms.  ? ?NAD Noted during Triage. A&Ox4. GCS 15. Ambulatory. ?

## 2021-08-26 ENCOUNTER — Emergency Department (HOSPITAL_BASED_OUTPATIENT_CLINIC_OR_DEPARTMENT_OTHER): Payer: BC Managed Care – PPO

## 2021-08-26 ENCOUNTER — Emergency Department (HOSPITAL_BASED_OUTPATIENT_CLINIC_OR_DEPARTMENT_OTHER)
Admission: EM | Admit: 2021-08-26 | Discharge: 2021-08-26 | Disposition: A | Discharge status: Home / Self Care | Payer: BC Managed Care – PPO | Attending: Emergency Medicine | Admitting: Emergency Medicine

## 2021-08-26 DIAGNOSIS — R200 Anesthesia of skin: Secondary | ICD-10-CM

## 2021-08-26 MED ORDER — PREDNISONE 20 MG PO TABS: 40.0000 mg | ORAL_TABLET | Freq: Once | ORAL | Status: DC

## 2021-08-26 MED ORDER — PREDNISONE 10 MG PO TABS: 20.0000 mg | ORAL_TABLET | Freq: Two times a day (BID) | ORAL | 0 refills | Status: DC

## 2021-08-26 NOTE — ED Notes (Signed)
Patient transported to CT 

## 2021-08-26 NOTE — ED Provider Notes (Signed)
?[facility] ?Provider Note ? ? ?CSN: [id number] ?Arrival date & time: 08/25/21  2130 ? ?  ? ?History ? ?Chief Complaint  ?Patient presents with  ? Numbness  ? ? ?Richard Shaffer is a 51 y.o. male. ? ?Patient is a 51 year old male with no significant past medical history.  Patient presenting today with complaints of right arm numbness.  This has been occurring intermittently for the past several days.  He denies any facial involvement or leg involvement.  He denies any visual disturbances.  He denies any headache or neck pain.  There are no aggravating or alleviating factors.  Patient does perform repetitive movements at work in a [employer], but denies any specific injury or trauma. ? ?The history is provided by the patient.  ? ?  ? ?Home Medications ?Prior to Admission medications   ?Medication Sig Start Date End Date Taking? Authorizing Provider  ?Multiple Vitamins-Minerals (MULTIVITAMIN MEN PO) Take by mouth.    [provider]  ?predniSONE (DELTASONE) 20 MG tablet Take 2 tablets (40 mg total) by mouth daily. 12/01/20   Raspet, Derry Skill, PA-C  ?promethazine-dextromethorphan (PROMETHAZINE-DM) 6.25-15 MG/5ML syrup Take 5 mLs by mouth 2 (two) times daily as needed for cough. 12/01/20   Raspet, Derry Skill, PA-C  ?   ? ?Allergies    ?Patient has no known allergies.   ? ?Review of Systems   ?Review of Systems  ?All other systems reviewed and are negative. ? ?Physical Exam ?Updated Vital Signs ?BP 116/86   Pulse 75   Temp 98.4 ?F (36.9 ?C)   Resp 16   Ht '6\' 3"'$  (1.905 m)   Wt 92.6 kg   SpO2 94%   BMI 25.52 kg/m?  ?Physical Exam ?Vitals and nursing note reviewed.  ?Constitutional:   ?   General: He is not in acute distress. ?   Appearance: He is well-developed. He is not diaphoretic.  ?HENT:  ?   Head: Normocephalic and atraumatic.  ?Cardiovascular:  ?   Rate and Rhythm: Normal rate and regular rhythm.  ?   Heart sounds: No murmur heard. ?  No friction rub.  ?Pulmonary:  ?   Effort:  Pulmonary effort is normal. No respiratory distress.  ?   Breath sounds: Normal breath sounds. No wheezing or rales.  ?Abdominal:  ?   General: Bowel sounds are normal. There is no distension.  ?   Palpations: Abdomen is soft.  ?   Tenderness: There is no abdominal tenderness.  ?Musculoskeletal:     ?   General: Normal range of motion.  ?   Cervical back: Normal range of motion and neck supple.  ?Skin: ?   General: Skin is warm and dry.  ?Neurological:  ?   General: No focal deficit present.  ?   Mental Status: He is alert and oriented to person, place, and time.  ?   Cranial Nerves: No cranial nerve deficit.  ?   Motor: No weakness.  ?   Coordination: Coordination normal.  ? ? ?ED Results / Procedures / Treatments   ?Labs ?(all labs ordered are listed, but only abnormal results are displayed) ?Labs Reviewed  ?PROTIME-INR  ?APTT  ?CBC  ?DIFFERENTIAL  ?COMPREHENSIVE METABOLIC PANEL  ?CBG MONITORING, ED  ?CBG MONITORING, ED  ? ? ?EKG ?None ? ?Radiology ?No results found. ? ?Procedures ?Procedures  ? ? ?Medications Ordered in ED ?Medications - No data to display ? ?ED Course/ Medical Decision Making/ A&P ? ?This patient presents to the  ED for concern of right arm and hand numbness, this involves an extensive number of treatment options, and is a complaint that carries with it a high risk of complications and morbidity.  The differential diagnosis includes acute stroke, radiculopathy ? ? ?Co morbidities that complicate the patient evaluation ? ?None ? ? ?Additional history obtained: ? ?No additional history or external records needed ? ? ?Lab Tests: ? ?I Ordered, and personally interpreted labs.  The pertinent results include: Unremarkable laboratory studies ? ? ?Imaging Studies ordered: ? ?I ordered imaging studies including CT scan of the head ?I independently visualized and interpreted imaging which showed no acute process ?I agree with the radiologist interpretation ? ? ?Cardiac Monitoring: / EKG: ? ?None  performed ? ? ?Consultations Obtained: ? ?No consultations obtained ? ? ?Problem List / ED Course / Critical interventions / Medication management ? ?Patient presenting with right arm numbness, the etiology of which I am uncertain.  I doubt stroke as his symptoms seem to come and go.  He is otherwise neurologically intact and head CT is negative.  Laboratory studies are reassuring.  I suspect this may be a radiculopathy and will treat with prednisone and follow-up as needed. ?I have reviewed the patients home medicines and have made adjustments as needed ? ? ?Social Determinants of Health: ? ?None ? ? ?Test / Admission - Considered: ? ?Patient to be discharged with prednisone and follow-up with primary doctor if not improving. ? ? ?Final Clinical Impression(s) / ED Diagnoses ?Final diagnoses:  ?None  ? ? ?Rx / DC Orders ?ED Discharge Orders   ? ? None  ? ?  ? ? ?  ?Veryl Speak, MD ?08/26/21 [phone] ? ?

## 2021-08-26 NOTE — Discharge Instructions (Signed)
Begin taking prednisone as prescribed. ? ?Follow-up with your primary doctor if symptoms are not improving in the next week. ?

## 2021-09-24 ENCOUNTER — Encounter: Payer: Self-pay | Admitting: Emergency Medicine

## 2021-09-24 ENCOUNTER — Ambulatory Visit
Admission: EM | Admit: 2021-09-24 | Discharge: 2021-09-24 | Disposition: A | Discharge status: Home / Self Care | Payer: BC Managed Care – PPO | Attending: Emergency Medicine | Admitting: Emergency Medicine

## 2021-09-24 ENCOUNTER — Ambulatory Visit (INDEPENDENT_AMBULATORY_CARE_PROVIDER_SITE_OTHER): Payer: BC Managed Care – PPO

## 2021-09-24 DIAGNOSIS — S6722XA Crushing injury of left hand, initial encounter: Secondary | ICD-10-CM

## 2021-09-24 DIAGNOSIS — S61213A Laceration without foreign body of left middle finger without damage to nail, initial encounter: Secondary | ICD-10-CM | POA: Diagnosis not present

## 2021-09-24 DIAGNOSIS — Z23 Encounter for immunization: Secondary | ICD-10-CM

## 2021-09-24 DIAGNOSIS — S60032A Contusion of left middle finger without damage to nail, initial encounter: Secondary | ICD-10-CM

## 2021-09-24 MED ORDER — TETANUS-DIPHTH-ACELL PERTUSSIS 5-2.5-18.5 LF-MCG/0.5 IM SUSY
0.5000 mL | PREFILLED_SYRINGE | Freq: Once | INTRAMUSCULAR | Status: AC
  Administered 2021-09-24: 0.5 mL via INTRAMUSCULAR

## 2021-09-24 MED ORDER — IBUPROFEN 800 MG PO TABS
800.0000 mg | ORAL_TABLET | Freq: Once | ORAL | Status: AC
  Administered 2021-09-24: 800 mg via ORAL

## 2021-09-24 MED ORDER — AMOXICILLIN-POT CLAVULANATE 875-125 MG PO TABS: 1.0000 | ORAL_TABLET | Freq: Two times a day (BID) | ORAL | 0 refills | Status: AC
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
## 2021-09-24 NOTE — Discharge Instructions (Signed)
At this time, there is no evidence of any fracture or crushing injury to your left hand.  The Steri-Strip that was applied to close the wound on your middle finger will come loose on its own.  After 10 days, please feel free to remove it yourself if it has not fallen off already.  You received a tetanus booster today, please make a note of the date for your records.  Please go to your pharmacy to pick up a prescription for Augmentin and take 1 tablet twice daily for the next 5 days to prevent infection.    Please monitor your wounds for any signs of increased redness, swelling.  Please monitor your fingers for numbness and loss of function.  If any of these occur, please go to the emergency for further evaluation.  Thank you for visiting urgent care today.

## 2021-09-24 NOTE — ED Notes (Signed)
Patient states he work in [employer] and had some bars roll on his hand and cut his 2nd and 3rd digits on the left hand.   Pain 7/10
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
## 2022-01-01 ENCOUNTER — Ambulatory Visit
Admission: EM | Admit: 2022-01-01 | Discharge: 2022-01-01 | Disposition: A | Discharge status: Home / Self Care | Payer: BC Managed Care – PPO | Attending: Physician Assistant | Admitting: Physician Assistant

## 2022-01-01 DIAGNOSIS — H1033 Unspecified acute conjunctivitis, bilateral: Secondary | ICD-10-CM

## 2022-01-01 MED ORDER — POLYMYXIN B-TRIMETHOPRIM 10000-0.1 UNIT/ML-% OP SOLN: 1.0000 [drp] | OPHTHALMIC | 0 refills | Status: AC

## 2022-01-01 NOTE — ED Triage Notes (Signed)
Pt presents with intermittent bilateral eye itchiness, redness, and watering X 4 days
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
## 2022-01-31 ENCOUNTER — Emergency Department (HOSPITAL_BASED_OUTPATIENT_CLINIC_OR_DEPARTMENT_OTHER)
Admission: EM | Admit: 2022-01-31 | Discharge: 2022-01-31 | Disposition: A | Discharge status: Home / Self Care | Payer: Self-pay | Attending: Student | Admitting: Student

## 2022-01-31 ENCOUNTER — Emergency Department (HOSPITAL_BASED_OUTPATIENT_CLINIC_OR_DEPARTMENT_OTHER): Payer: Self-pay

## 2022-01-31 ENCOUNTER — Encounter (HOSPITAL_BASED_OUTPATIENT_CLINIC_OR_DEPARTMENT_OTHER): Payer: Self-pay

## 2022-01-31 DIAGNOSIS — M19049 Primary osteoarthritis, unspecified hand: Secondary | ICD-10-CM

## 2022-01-31 DIAGNOSIS — M19041 Primary osteoarthritis, right hand: Secondary | ICD-10-CM | POA: Insufficient documentation

## 2022-01-31 MED ORDER — NAPROXEN 375 MG PO TABS: 375.0000 mg | ORAL_TABLET | Freq: Two times a day (BID) | ORAL | 0 refills | Status: DC

## 2022-01-31 MED ORDER — KETOROLAC TROMETHAMINE 15 MG/ML IJ SOLN
15.0000 mg | Freq: Once | INTRAMUSCULAR | Status: AC
  Administered 2022-01-31: 15 mg via INTRAMUSCULAR
  Filled 2022-01-31: qty 1

## 2022-01-31 NOTE — ED Triage Notes (Signed)
C/o non-traumatic right arm pain since yesterday. He states this has been a recurring problem of his right arm for "about a year now". CMS normal all fingers bilat.
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
## 2022-07-30 ENCOUNTER — Emergency Department (HOSPITAL_COMMUNITY)
Admission: EM | Admit: 2022-07-30 | Discharge: 2022-07-30 | Disposition: A | Discharge status: Home / Self Care | Payer: 59 | Attending: Emergency Medicine | Admitting: Emergency Medicine

## 2022-07-30 ENCOUNTER — Emergency Department (HOSPITAL_COMMUNITY): Payer: 59

## 2022-07-30 ENCOUNTER — Other Ambulatory Visit: Payer: Self-pay

## 2022-07-30 ENCOUNTER — Encounter (HOSPITAL_COMMUNITY): Payer: Self-pay

## 2022-07-30 DIAGNOSIS — R0602 Shortness of breath: Secondary | ICD-10-CM | POA: Diagnosis not present

## 2022-07-30 DIAGNOSIS — R072 Precordial pain: Secondary | ICD-10-CM | POA: Insufficient documentation

## 2022-07-30 LAB — TROPONIN I (HIGH SENSITIVITY)
Troponin I (High Sensitivity): 5 ng/L (ref <18)
Troponin I (High Sensitivity): 4 ng/L (ref <18)

## 2022-07-30 LAB — BASIC METABOLIC PANEL
Anion gap: 15 (ref 5–15)
BUN: 16 mg/dL (ref 6–20)
CO2: 23 mmol/L (ref 22–32)
Calcium: 9 mg/dL (ref 8.9–10.3)
Chloride: 99 mmol/L (ref 98–111)
Creatinine, Ser: 1.33 mg/dL — ABNORMAL HIGH (ref 0.61–1.24)
GFR, Estimated: >60 mL/min (ref >60)
Glucose, Bld: 81 mg/dL (ref 70–99)
Potassium: 3.8 mmol/L (ref 3.5–5.1)
Sodium: 137 mmol/L (ref 135–145)

## 2022-07-30 LAB — CBC
HCT: 47.2 % (ref 39.0–52.0)
Hemoglobin: 15.1 g/dL (ref 13.0–17.0)
MCH: 31.6 pg (ref 26.0–34.0)
MCHC: 32 g/dL (ref 30.0–36.0)
MCV: 98.7 fL (ref 80.0–100.0)
Platelets: 304 10*3/uL (ref 150–400)
RBC: 4.78 MIL/uL (ref 4.22–5.81)
RDW: 13.6 % (ref 11.5–15.5)
WBC: 9.4 10*3/uL (ref 4.0–10.5)
nRBC: 0 % (ref 0.0–0.2)

## 2022-07-30 LAB — D-DIMER, QUANTITATIVE: D-Dimer, Quant: <0.27 ug/mL-FEU (ref 0.00–0.50)

## 2022-07-30 NOTE — ED Triage Notes (Signed)
Pt stated, I was laying down and Ive got home from work and I felt this SOB and some chest pain with Nausea . Still having the SOB. Ive had previously but not like this.

## 2022-07-30 NOTE — Discharge Instructions (Signed)
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
## 2022-09-10 DIAGNOSIS — R072 Precordial pain: Secondary | ICD-10-CM | POA: Insufficient documentation
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
## 2022-09-15 ENCOUNTER — Encounter: Payer: Self-pay | Admitting: Cardiovascular Disease

## 2022-09-15 ENCOUNTER — Ambulatory Visit: Payer: 59 | Attending: Cardiovascular Disease | Admitting: Cardiovascular Disease

## 2022-09-15 VITALS — BP 116/74 | HR 80 | Ht 75.0 in | Wt 203.0 lb

## 2022-09-15 DIAGNOSIS — R0602 Shortness of breath: Secondary | ICD-10-CM

## 2022-09-15 DIAGNOSIS — R072 Precordial pain: Secondary | ICD-10-CM

## 2022-09-15 NOTE — Patient Instructions (Signed)
Medication Instructions:  No changes *If you need a refill on your cardiac medications before your next appointment, please call your pharmacy*   Lab Work: none   Testing/Procedures: Your physician has requested that you have an echocardiogram. Echocardiography is a painless test that uses sound waves to create images of your heart. It provides your doctor with information about the size and shape of your heart and how well your heart's chambers and valves are working. This procedure takes approximately one hour. There are no restrictions for this procedure. Please do NOT wear cologne, perfume, aftershave, or lotions (deodorant is allowed). Please arrive 15 minutes prior to your appointment time.   Follow-Up: As needed  

## 2022-10-19 ENCOUNTER — Ambulatory Visit (HOSPITAL_COMMUNITY): Payer: 59 | Attending: Cardiovascular Disease

## 2022-10-19 DIAGNOSIS — R0602 Shortness of breath: Secondary | ICD-10-CM | POA: Diagnosis present

## 2022-10-19 LAB — ECHOCARDIOGRAM COMPLETE
Area-P 1/2: 3.21 cm2
S' Lateral: 3.4 cm

## 2022-12-11 ENCOUNTER — Emergency Department (HOSPITAL_BASED_OUTPATIENT_CLINIC_OR_DEPARTMENT_OTHER): Payer: 59

## 2022-12-11 ENCOUNTER — Encounter (HOSPITAL_BASED_OUTPATIENT_CLINIC_OR_DEPARTMENT_OTHER): Payer: Self-pay | Admitting: Emergency Medicine

## 2022-12-11 ENCOUNTER — Other Ambulatory Visit: Payer: Self-pay

## 2022-12-11 ENCOUNTER — Emergency Department (HOSPITAL_BASED_OUTPATIENT_CLINIC_OR_DEPARTMENT_OTHER)
Admission: EM | Admit: 2022-12-11 | Discharge: 2022-12-11 | Disposition: A | Discharge status: Home / Self Care | Payer: 59 | Attending: Emergency Medicine | Admitting: Emergency Medicine

## 2022-12-11 DIAGNOSIS — R109 Unspecified abdominal pain: Secondary | ICD-10-CM

## 2022-12-11 DIAGNOSIS — R1011 Right upper quadrant pain: Secondary | ICD-10-CM | POA: Insufficient documentation

## 2022-12-11 DIAGNOSIS — R1031 Right lower quadrant pain: Secondary | ICD-10-CM | POA: Insufficient documentation

## 2022-12-11 LAB — CBC WITH DIFFERENTIAL/PLATELET
Abs Immature Granulocytes: 0.01 10*3/uL (ref 0.00–0.07)
Basophils Absolute: 0 10*3/uL (ref 0.0–0.1)
Basophils Relative: 0 %
Eosinophils Absolute: 0.2 10*3/uL (ref 0.0–0.5)
Eosinophils Relative: 3 %
HCT: 43.7 % (ref 39.0–52.0)
Hemoglobin: 14.6 g/dL (ref 13.0–17.0)
Immature Granulocytes: 0 %
Lymphocytes Relative: 26 %
Lymphs Abs: 1.9 10*3/uL (ref 0.7–4.0)
MCH: 31.7 pg (ref 26.0–34.0)
MCHC: 33.4 g/dL (ref 30.0–36.0)
MCV: 95 fL (ref 80.0–100.0)
Monocytes Absolute: 1 10*3/uL (ref 0.1–1.0)
Monocytes Relative: 14 %
Neutro Abs: 4 10*3/uL (ref 1.7–7.7)
Neutrophils Relative %: 57 %
Platelets: 280 10*3/uL (ref 150–400)
RBC: 4.6 MIL/uL (ref 4.22–5.81)
RDW: 13.7 % (ref 11.5–15.5)
WBC: 7.2 10*3/uL (ref 4.0–10.5)
nRBC: 0 % (ref 0.0–0.2)

## 2022-12-11 LAB — COMPREHENSIVE METABOLIC PANEL
ALT: 31 U/L (ref 0–44)
AST: 29 U/L (ref 15–41)
Albumin: 4.1 g/dL (ref 3.5–5.0)
Alkaline Phosphatase: 66 U/L (ref 38–126)
Anion gap: 10 (ref 5–15)
BUN: 17 mg/dL (ref 6–20)
CO2: 23 mmol/L (ref 22–32)
Calcium: 8.8 mg/dL — ABNORMAL LOW (ref 8.9–10.3)
Chloride: 102 mmol/L (ref 98–111)
Creatinine, Ser: 1.12 mg/dL (ref 0.61–1.24)
GFR, Estimated: >60 mL/min (ref >60)
Glucose, Bld: 95 mg/dL (ref 70–99)
Potassium: 4.1 mmol/L (ref 3.5–5.1)
Sodium: 135 mmol/L (ref 135–145)
Total Bilirubin: 0.4 mg/dL (ref 0.3–1.2)
Total Protein: 7.4 g/dL (ref 6.5–8.1)

## 2022-12-11 LAB — URINALYSIS, ROUTINE W REFLEX MICROSCOPIC
Bilirubin Urine: NEGATIVE
Glucose, UA: NEGATIVE mg/dL
Hgb urine dipstick: NEGATIVE
Ketones, ur: NEGATIVE mg/dL
Leukocytes,Ua: NEGATIVE
Nitrite: NEGATIVE
Protein, ur: NEGATIVE mg/dL
Specific Gravity, Urine: 1.019 (ref 1.005–1.030)
pH: 6.5 (ref 5.0–8.0)

## 2022-12-11 LAB — LIPASE, BLOOD: Lipase: 25 U/L (ref 11–51)

## 2022-12-11 MED ORDER — KETOROLAC TROMETHAMINE 15 MG/ML IJ SOLN
15.0000 mg | Freq: Once | INTRAMUSCULAR | Status: AC
  Administered 2022-12-11: 15 mg via INTRAVENOUS
  Filled 2022-12-11: qty 1

## 2022-12-11 MED ORDER — IOHEXOL 300 MG/ML  SOLN
100.0000 mL | Freq: Once | INTRAMUSCULAR | Status: AC | PRN
  Administered 2022-12-11: 100 mL via INTRAVENOUS

## 2022-12-11 NOTE — ED Triage Notes (Signed)
Right flank pain, since Wednesday, hurts worse with movement,bending.coughing, sneezing. No problems voiding or having bm's.

## 2022-12-11 NOTE — Discharge Instructions (Addendum)
It was a pleasure taking care of you today!  Your lab and imaging studies did not show any concerning emergent findings at this time.  Your CT did show an incidental finding of changes to your liver to which it is recommended that you follow up with your primary care provider for a MRI of your abdomen. You may take over the counter 500 mg tylenol every 6 hours and alternate with 600 mg ibuprofen every 6 hours as needed for your symptoms for no more than 7 days. You may place ice/warm compress to the affected area up to 15 minutes at a time, ensure to place a barrier between your skin and the ice. Ensure to maintain fluid intake with water, tea, broth, soup, Pedialyte, Gatorade.  Follow-up with your primary care provider as needed regarding this ED visit.  Return to the emergency department if you experience increasing/worsening symptoms.

## 2022-12-11 NOTE — ED Provider Notes (Signed)
[place] EMERGENCY DEPARTMENT AT [facility] Provider Note   CSN: [id number] Arrival date & time: 12/11/22  1012     History  Chief Complaint  Patient presents with   Abdominal Pain    Richard Shaffer is a 52 y.o. male who presents emergency department with concerns for right sided abdominal pain onset 4 days.  Notes his abdominal pain is worse with movement, bending, coughing, sneezing.  No meds tried at home.  Notes heavy lifting at his job.  Denies any injury or trauma.  Denies nausea, vomiting, diarrhea, constipation, fever, urinary symptoms.  No past medical history of kidney stones.  The history is provided by the patient. No language interpreter was used.       Home Medications Prior to Admission medications   Medication Sig Start Date End Date Taking? Authorizing Provider  Multiple Vitamins-Minerals (MULTIVITAMIN MEN PO) Take by mouth.    [provider]  naproxen (NAPROSYN) 375 MG tablet Take 1 tablet (375 mg total) by mouth 2 (two) times daily. 01/31/22   Kommor, Madison, MD  predniSONE (DELTASONE) 10 MG tablet Take 2 tablets (20 mg total) by mouth 2 (two) times daily with a meal. 08/26/21   Geoffery Lyons, MD      Allergies    Patient has no known allergies.    Review of Systems   Review of Systems  All other systems reviewed and are negative.   Physical Exam Updated Vital Signs BP 118/84 (BP Location: Right Arm)   Pulse 74   Temp 98.7 F (37.1 C) (Oral)   Resp 18   SpO2 97%  Physical Exam Vitals and nursing note reviewed.  Constitutional:      General: He is not in acute distress.    Appearance: He is not diaphoretic.  HENT:     Head: Normocephalic and atraumatic.     Mouth/Throat:     Pharynx: No oropharyngeal exudate.  Eyes:     General: No scleral icterus.    Conjunctiva/sclera: Conjunctivae normal.  Cardiovascular:     Rate and Rhythm: Normal rate and regular rhythm.     Pulses: Normal pulses.     Heart sounds: Normal  heart sounds.  Pulmonary:     Effort: Pulmonary effort is normal. No respiratory distress.     Breath sounds: Normal breath sounds. No wheezing.  Abdominal:     General: Bowel sounds are normal.     Palpations: Abdomen is soft. There is no mass.     Tenderness: There is abdominal tenderness in the right upper quadrant and right lower quadrant. There is no right CVA tenderness, left CVA tenderness, guarding or rebound.  Musculoskeletal:        General: Normal range of motion.     Cervical back: Normal range of motion and neck supple.  Skin:    General: Skin is warm and dry.  Neurological:     Mental Status: He is alert.  Psychiatric:        Behavior: Behavior normal.     ED Results / Procedures / Treatments   Labs (all labs ordered are listed, but only abnormal results are displayed) Labs Reviewed  COMPREHENSIVE METABOLIC PANEL - Abnormal; Notable for the following components:      Result Value   Calcium 8.8 (*)    All other components within normal limits  CBC WITH DIFFERENTIAL/PLATELET  LIPASE, BLOOD  URINALYSIS, ROUTINE W REFLEX MICROSCOPIC    EKG None  Radiology CT ABDOMEN PELVIS W CONTRAST  Result  Date: 12/11/2022 CLINICAL DATA:  Right flank pain since Wednesday. EXAM: CT ABDOMEN AND PELVIS WITH CONTRAST TECHNIQUE: Multidetector CT imaging of the abdomen and pelvis was performed using the standard protocol following bolus administration of intravenous contrast. RADIATION DOSE REDUCTION: This exam was performed according to the departmental dose-optimization program which includes automated exposure control, adjustment of the mA and/or kV according to patient size and/or use of iterative reconstruction technique. CONTRAST:  OMNIPAQUE IOHEXOL 300 MG/ML  SOLN COMPARISON:  None Available. FINDINGS: Lower chest: No acute abnormality. Hepatobiliary: Ill-defined lobulated 1.6 x 0.7 cm hypodense mass in the right hepatic lobe of indeterminate etiology and may reflect a cyst  or hamartoma. No gallstones, gallbladder wall thickening, or biliary dilatation. Pancreas: Unremarkable. No pancreatic ductal dilatation or surrounding inflammatory changes. Spleen: Normal in size without focal abnormality. Adrenals/Urinary Tract: Adrenal glands are unremarkable. Kidneys are normal, without renal calculi, focal lesion, or hydronephrosis. Relative bladder wall thickening likely secondary to under distension versus less likely cystitis. Stomach/Bowel: Stomach is within normal limits. No evidence of bowel wall thickening, distention, or inflammatory changes. Diverticulosis of the descending colon without evidence of diverticulitis. Vascular/Lymphatic: No significant vascular findings are present. No enlarged abdominal or pelvic lymph nodes. Reproductive: Prostate is unremarkable. Other: No abdominal wall hernia or abnormality. No abdominopelvic ascites. Musculoskeletal: No acute osseous abnormality. No aggressive osseous lesion. Degenerative disease with disc height loss at L5-S1. IMPRESSION: 1. Relative bladder wall thickening likely secondary to under distension versus less likely cystitis. 2. Otherwise, no acute abdominal or pelvic pathology. 3. Diverticulosis of the descending colon without evidence of diverticulitis. 4. Ill-defined lobulated 1.6 x 0.7 cm hypodense mass in the right hepatic lobe of indeterminate etiology and may reflect a cyst or hamartoma. Recommend further characterization with a nonemergent MRI of the abdomen with and without contrast. Electronically Signed   By: Elige Ko M.D.   On: 12/11/2022 12:25    Procedures Procedures    Medications Ordered in ED Medications  iohexol (OMNIPAQUE) 300 MG/ML solution 100 mL (100 mLs Intravenous Contrast Given 12/11/22 1152)  ketorolac (TORADOL) 15 MG/ML injection 15 mg (15 mg Intravenous Given 12/11/22 1238)    ED Course/ Medical Decision Making/ A&P Clinical Course as of 12/11/22 1838  Sat Dec 11, 2022  1304 Re-evaluated and  noted improvement of symptoms with treatment regimen. Discussed with patient lab and imaging findings. Discussed discharge treatment plan. Pt agreeable at this time. Pt appears safe for discharge. [SB]    Clinical Course User Index [SB] Masson Nalepa A, PA-C                                 Medical Decision Making Amount and/or Complexity of Data Reviewed Labs: ordered. Radiology: ordered.  Risk Prescription drug management.   Patient presents to the emergency department with concerns for right-sided abdominal pain x 4 days.  Notes that the pain is worse with movement.  No meds tried at home.  Notes recent heavy lifting at his job.  Patient afebrile.  On exam patient with tenderness to palpation noted to the right upper and lower quadrants of the abdomen.  No CVA tenderness to palpation noted bilaterally.  No spinal tenderness to palpation.  Differential diagnosis includes cholecystitis, appendicitis, acute cystitis, pyelonephritis, nephrolithiasis, strain.   Labs:  I ordered, and personally interpreted labs.  The pertinent results include:  Lipase unremarkable Urinalysis unremarkable CBC unremarkable CMP unremarkable  Imaging: I ordered imaging studies  including CT abdomen pelvis with I independently visualized and interpreted imaging which showed  1. Relative bladder wall thickening likely secondary to under  distension versus less likely cystitis.  2. Otherwise, no acute abdominal or pelvic pathology.  3. Diverticulosis of the descending colon without evidence of  diverticulitis.  4. Ill-defined lobulated 1.6 x 0.7 cm hypodense mass in the right  hepatic lobe of indeterminate etiology and may reflect a cyst or  hamartoma. Recommend further characterization with a nonemergent MRI  of the abdomen with and without contrast.   I agree with the radiologist interpretation  Medications:  I ordered medication including Toradol for symptom management. Reevaluation of the patient  after these medicines and interventions, I reevaluated the patient and found that they have improved I have reviewed the patients home medicines and have made adjustments as needed  Disposition: Presenting suspicious for right-sided abdominal pain.  All concerns at this time for cholecystitis, appendicitis, acute cystitis, pyelonephritis, nephrolithiasis.  Right sided abdominal pain likely in the setting of strain with recent heavy lifting at his job. After consideration of the diagnostic results and the patients response to treatment, I feel that the patient would benefit from Discharge home.  Discussed with patient incidental finding of liver changes noted on CT.  Also discussed with patient regarding need for imaging due to incidental finding on the liver of nonemergent MRI of the abdomen.  Patient agreeable at this time.  Discussed with patient close follow-up with primary care provider.  Supportive care measures and strict return precautions discussed with patient at bedside. Pt acknowledges and verbalizes understanding. Pt appears safe for discharge. Follow up as indicated in discharge paperwork.   This chart was dictated using voice recognition software, Dragon. Despite the best efforts of this provider to proofread and correct errors, errors may still occur which can change documentation meaning.     Final Clinical Impression(s) / ED Diagnoses Final diagnoses:  Right sided abdominal pain    Rx / DC Orders ED Discharge Orders     None         Alexyss Balzarini A, PA-C 12/11/22 1839    Franne Forts, DO 12/19/22 1359

## 2022-12-27 ENCOUNTER — Encounter (HOSPITAL_BASED_OUTPATIENT_CLINIC_OR_DEPARTMENT_OTHER): Payer: Self-pay

## 2022-12-27 ENCOUNTER — Other Ambulatory Visit: Payer: Self-pay

## 2022-12-27 ENCOUNTER — Emergency Department (HOSPITAL_BASED_OUTPATIENT_CLINIC_OR_DEPARTMENT_OTHER)
Admission: EM | Admit: 2022-12-27 | Discharge: 2022-12-27 | Disposition: A | Discharge status: Home / Self Care | Payer: 59 | Attending: Emergency Medicine | Admitting: Emergency Medicine

## 2022-12-27 ENCOUNTER — Emergency Department (HOSPITAL_BASED_OUTPATIENT_CLINIC_OR_DEPARTMENT_OTHER): Payer: 59 | Admitting: Radiology

## 2022-12-27 DIAGNOSIS — M25562 Pain in left knee: Secondary | ICD-10-CM | POA: Diagnosis present

## 2022-12-27 DIAGNOSIS — M1732 Unilateral post-traumatic osteoarthritis, left knee: Secondary | ICD-10-CM | POA: Insufficient documentation

## 2022-12-27 MED ORDER — KETOROLAC TROMETHAMINE 15 MG/ML IJ SOLN
15.0000 mg | Freq: Once | INTRAMUSCULAR | Status: AC
  Administered 2022-12-27: 15 mg via INTRAMUSCULAR
  Filled 2022-12-27: qty 1

## 2022-12-27 NOTE — Discharge Instructions (Signed)
You were seen in the ER today for concerns of left knee pain. Your xray shows signs of moderate arthritis in the left knee. I would advise managing your pain at home with over the counter medications such as Tylenol, ibuprofen, or Aleve. Follow up with your primary care provider for further evaluation.

## 2022-12-27 NOTE — ED Triage Notes (Signed)
Pt to ED c/o lower back pain and left knee pain x 1 day. No known injury. Ambulatory in triage.

## 2022-12-28 NOTE — ED Provider Notes (Signed)
[place] EMERGENCY DEPARTMENT AT [facility] Provider Note   CSN: [id number] Arrival date & time: 12/27/22  7253     History Chief Complaint  Patient presents with   Knee Pain    left    Richard Shaffer is a 52 y.o. male.  Patient presents to the emergency department concerns of left knee pain.  Denies any recent injury or trauma to the area.  Does report that he switched jobs about a year ago or so which would require significant more mobility than his prior job.  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  Does also endorse prior surgical history to the left knee from a sports injury that resulted in a tibial/fibula fracture along with patellar tendon rupture.   Knee Pain      Home Medications Prior to Admission medications   Medication Sig Start Date End Date Taking? Authorizing Provider  Multiple Vitamins-Minerals (MULTIVITAMIN MEN PO) Take by mouth.    [provider]  naproxen (NAPROSYN) 375 MG tablet Take 1 tablet (375 mg total) by mouth 2 (two) times daily. 01/31/22   Kommor, Madison, MD  predniSONE (DELTASONE) 10 MG tablet Take 2 tablets (20 mg total) by mouth 2 (two) times daily with a meal. 08/26/21   Geoffery Lyons, MD      Allergies    Patient has no known allergies.    Review of Systems   Review of Systems  Musculoskeletal:        Knee pain  All other systems reviewed and are negative.   Physical Exam Updated Vital Signs BP 109/73   Pulse 65   Temp 98.4 F (36.9 C)   Resp 16   Ht 6\' 3"  (1.905 m)   Wt 88.5 kg   SpO2 95%   BMI 24.37 kg/m  Physical Exam Vitals and nursing note reviewed.  Constitutional:      General: He is not in acute distress.    Appearance: He is well-developed.  HENT:     Head: Normocephalic and atraumatic.  Eyes:     General: No scleral icterus.       Right eye: No discharge.        Left eye: No discharge.     Conjunctiva/sclera: Conjunctivae normal.  Cardiovascular:     Rate  and Rhythm: Normal rate and regular rhythm.     Heart sounds: No murmur heard. Pulmonary:     Effort: Pulmonary effort is normal. No respiratory distress.     Breath sounds: Normal breath sounds.  Abdominal:     Palpations: Abdomen is soft.     Tenderness: There is no abdominal tenderness.  Musculoskeletal:        General: No swelling, tenderness, deformity or signs of injury. Normal range of motion.     Cervical back: Neck supple.     Comments: Range of motion preserved in left knee compared to right knee.  Patient able to ambulate without difficulty.  No obvious deformity when comparing left to right knees.  Skin:    General: Skin is warm and dry.     Capillary Refill: Capillary refill takes less than 2 seconds.     Findings: No rash.  Neurological:     Mental Status: He is alert.  Psychiatric:        Mood and Affect: Mood normal.     ED Results / Procedures / Treatments   Labs (all labs ordered are listed, but only abnormal results are displayed)  Labs Reviewed - No data to display  EKG None  Radiology DG Knee Complete 4 Views Left  Result Date: 12/27/2022 CLINICAL DATA:  Left knee pain.  No known injury. EXAM: LEFT KNEE - COMPLETE 4+ VIEW COMPARISON:  None Available. FINDINGS: Postoperative changes with screws in the proximal tibia. Moderate tricompartment degenerative changes with joint space narrowing and spurring, most pronounced in the patellofemoral compartment. No joint effusion. No acute bony abnormality. Specifically, no fracture, subluxation, or dislocation. IMPRESSION: Moderate tricompartment degenerative changes. No acute bony abnormality. Electronically Signed   By: Charlett Nose M.D.   On: 12/27/2022 20:24    Procedures Procedures   Medications Ordered in ED Medications  ketorolac (TORADOL) 15 MG/ML injection 15 mg (15 mg Intramuscular Given 12/27/22 2331)    ED Course/ Medical Decision Making/ A&P                               Medical Decision  Making Amount and/or Complexity of Data Reviewed Radiology: ordered.  Risk Prescription drug management.   This patient presents to the ED for concern of knee pain.  Differential diagnosis includes arthritis, meniscal tear, septic arthritis   Imaging Studies ordered:  I ordered imaging studies including x-ray of the left knee I independently visualized and interpreted imaging which showed moderate tricompartment degenerative changes I agree with the radiologist interpretation   Medicines ordered and prescription drug management:  I ordered medication including Toradol for pain Reevaluation of the patient after these medicines showed that the patient improved I have reviewed the patients home medicines and have made adjustments as needed   Problem List / ED Course:  Patient presents to the emergency department concerns of left knee pain.  Reports has been ongoing for the last day or so but does report a prior history of injury to this left knee requiring surgical repair.  Denies any recent injury to the area but does report that through his occupation he has been increasing mobility likely having added strain to this left knee.  Denies any recent trauma to the site.  Extremity ordered from triage for evaluation. X-ray unremarkable.  On physical exam, no acute findings noted to account for any specific injury mechanism of injury.  Low concern for ligamental injury of the knee.  Some crepitus is noted likely indicating arthritis in the knee.  Discussed management of symptoms at home such as physical activity as well as anti-inflammatory medication such as ibuprofen or naproxen.  Low concern at this time for septic arthritis as patient is afebrile and no significant knee swelling or erythema noted. Encourage patient to follow-up with primary care provider or eventually with orthopedics if symptoms or not improving.  Dose of 50 mg IM Toradol given here in the emergency department prior to  discharge.  Strict return precautions discussed.  Patient agreeable treatment plan and verbalized understanding strict return precautions.  All questions answered prior to patient discharge.  Final Clinical Impression(s) / ED Diagnoses Final diagnoses:  Post-traumatic osteoarthritis of left knee    Rx / DC Orders ED Discharge Orders     None         Smitty Knudsen, PA-C 12/28/22 0013    Rondel Baton, MD 12/28/22 1227

## 2023-02-11 ENCOUNTER — Other Ambulatory Visit: Payer: Self-pay | Admitting: Physician Assistant

## 2023-02-11 DIAGNOSIS — R160 Hepatomegaly, not elsewhere classified: Secondary | ICD-10-CM

## 2023-02-13 ENCOUNTER — Ambulatory Visit
Admission: RE | Admit: 2023-02-13 | Discharge: 2023-02-13 | Disposition: A | Discharge status: Home / Self Care | Payer: 59 | Source: Ambulatory Visit | Attending: Physician Assistant | Admitting: Physician Assistant

## 2023-02-13 DIAGNOSIS — R160 Hepatomegaly, not elsewhere classified: Secondary | ICD-10-CM

## 2023-02-26 ENCOUNTER — Encounter (HOSPITAL_COMMUNITY): Payer: Self-pay | Admitting: Emergency Medicine

## 2023-02-26 ENCOUNTER — Emergency Department (HOSPITAL_COMMUNITY): Payer: 59

## 2023-02-26 ENCOUNTER — Emergency Department (HOSPITAL_COMMUNITY)
Admission: EM | Admit: 2023-02-26 | Discharge: 2023-02-26 | Disposition: A | Discharge status: Home / Self Care | Payer: 59 | Attending: Emergency Medicine | Admitting: Emergency Medicine

## 2023-02-26 DIAGNOSIS — R079 Chest pain, unspecified: Secondary | ICD-10-CM | POA: Diagnosis present

## 2023-02-26 LAB — CBC
HCT: 45.4 % (ref 39.0–52.0)
Hemoglobin: 15 g/dL (ref 13.0–17.0)
MCH: 31.3 pg (ref 26.0–34.0)
MCHC: 33 g/dL (ref 30.0–36.0)
MCV: 94.8 fL (ref 80.0–100.0)
Platelets: 294 10*3/uL (ref 150–400)
RBC: 4.79 MIL/uL (ref 4.22–5.81)
RDW: 13.4 % (ref 11.5–15.5)
WBC: 8.4 10*3/uL (ref 4.0–10.5)
nRBC: 0 % (ref 0.0–0.2)

## 2023-02-26 LAB — TROPONIN I (HIGH SENSITIVITY)
Troponin I (High Sensitivity): 4 ng/L (ref <18)
Troponin I (High Sensitivity): 3 ng/L (ref <18)

## 2023-02-26 LAB — BASIC METABOLIC PANEL
Anion gap: 9 (ref 5–15)
BUN: 17 mg/dL (ref 6–20)
CO2: 23 mmol/L (ref 22–32)
Calcium: 9.2 mg/dL (ref 8.9–10.3)
Chloride: 105 mmol/L (ref 98–111)
Creatinine, Ser: 1.22 mg/dL (ref 0.61–1.24)
GFR, Estimated: >60 mL/min (ref >60)
Glucose, Bld: 100 mg/dL — ABNORMAL HIGH (ref 70–99)
Potassium: 3.8 mmol/L (ref 3.5–5.1)
Sodium: 137 mmol/L (ref 135–145)

## 2023-02-26 MED ORDER — FAMOTIDINE 40 MG PO TABS: 40.0000 mg | ORAL_TABLET | Freq: Every day | ORAL | 0 refills | Status: DC

## 2023-02-26 MED ORDER — ALUM & MAG HYDROXIDE-SIMETH 200-200-20 MG/5ML PO SUSP
15.0000 mL | Freq: Once | ORAL | Status: AC
  Administered 2023-02-26: 15 mL via ORAL
  Filled 2023-02-26: qty 30

## 2023-02-26 NOTE — ED Triage Notes (Signed)
Pt here from home with c/o chest pain that started this morning around 5 am , no sob or n/v , pt also has a small lump to his left upper abd

## 2023-02-26 NOTE — ED Provider Notes (Signed)
[place] EMERGENCY DEPARTMENT AT [facility] Provider Note   CSN: [id number] Arrival date & time: 02/26/23  1702     History  Chief Complaint  Patient presents with   Chest Pain    Richard Shaffer is a 52 y.o. male.  This is a 52 year old male is here today for an episode of chest pain.  Patient had pain in the middle of his chest when he woke up this morning.  He says that he had something similar about a year ago, he followed up with a cardiologist, and said that all of his testing done was normal.  He has not felt short of breath, not any recent travel, no trauma to the area.   Chest Pain      Home Medications Prior to Admission medications   Medication Sig Start Date End Date Taking? Authorizing Provider  famotidine (PEPCID) 40 MG tablet Take 1 tablet (40 mg total) by mouth daily. 02/26/23 03/28/23 Yes Anders Simmonds T, DO  Multiple Vitamins-Minerals (MULTIVITAMIN MEN PO) Take by mouth.    [provider]  naproxen (NAPROSYN) 375 MG tablet Take 1 tablet (375 mg total) by mouth 2 (two) times daily. 01/31/22   Kommor, Madison, MD  predniSONE (DELTASONE) 10 MG tablet Take 2 tablets (20 mg total) by mouth 2 (two) times daily with a meal. 08/26/21   Geoffery Lyons, MD      Allergies    Patient has no known allergies.    Review of Systems   Review of Systems  Cardiovascular:  Positive for chest pain.    Physical Exam Updated Vital Signs BP 130/86   Pulse 63   Temp 98.1 F (36.7 C) (Oral)   Resp 19   SpO2 97%  Physical Exam Vitals reviewed.  Constitutional:      Appearance: He is well-developed.  HENT:     Head: Normocephalic and atraumatic.  Cardiovascular:     Rate and Rhythm: Normal rate.  Pulmonary:     Effort: Pulmonary effort is normal.     Breath sounds: Normal breath sounds.  Musculoskeletal:     Cervical back: Normal range of motion.     Right lower leg: No edema.     Left lower leg: No edema.  Neurological:     Mental  Status: He is alert.     ED Results / Procedures / Treatments   Labs (all labs ordered are listed, but only abnormal results are displayed) Labs Reviewed  BASIC METABOLIC PANEL - Abnormal; Notable for the following components:      Result Value   Glucose, Bld 100 (*)    All other components within normal limits  CBC  TROPONIN I (HIGH SENSITIVITY)  TROPONIN I (HIGH SENSITIVITY)    EKG EKG Interpretation Date/Time:  Saturday February 26 2023 17:04:51 EDT Ventricular Rate:  78 PR Interval:  164 QRS Duration:  88 QT Interval:  358 QTC Calculation: 408 R Axis:   73  Text Interpretation: Normal sinus rhythm Normal ECG When compared with ECG of 30-Jul-2022 09:03, PREVIOUS ECG IS PRESENT Confirmed by Anders Simmonds [phone]) on 02/26/2023 5:37:37 PM  Radiology DG Chest 2 View  Result Date: 02/26/2023 CLINICAL DATA:  Chest pain EXAM: CHEST - 2 VIEW COMPARISON:  07/30/2022 FINDINGS: The heart size and mediastinal contours are within normal limits. No focal airspace consolidation, pleural effusion, or pneumothorax. Remote healed right lower rib fractures. IMPRESSION: No active cardiopulmonary disease. Electronically Signed   By: Duanne Guess D.O.   On:  02/26/2023 18:18    Procedures Ultrasound ED Echo  Date/Time: 02/26/2023 7:09 PM  Performed by: Arletha Pili, DO Authorized by: Arletha Pili, DO   Procedure details:    Indications: chest pain     Views: subxiphoid, parasternal long axis view and parasternal short axis view     Images: archived   Findings:    Pericardium: no pericardial effusion     LV Function: normal (>50% EF)   Impression:    Impression: normal       Medications Ordered in ED Medications  alum & mag hydroxide-simeth (MAALOX/MYLANTA) 200-200-20 MG/5ML suspension 15 mL (15 mLs Oral Given 02/26/23 1918)    ED Course/ Medical Decision Making/ A&P                                 Medical Decision Making 52 year old male here today for chest  pain.  Differential diagnoses include ACS, GERD, less likely dissection, less likely pneumonia, less likely pneumothorax.  Plan-I reviewed the patient's medications, confirmed them with him at bedside.  Patient with nonreproducible chest pain, since resolved.  Had an episode this morning that he said felt similar to previous.  My dependent review the patient's EKG shows no acute ischemia.  Patient not tachypneic, not tachycardic.  Clear lung sounds, normal heart sounds.  Bedside ultrasound normal.  I do think there is potential for GERD being associated with this patient's symptoms.  Does endorse occasional having bad taste in his mouth, burning sensation.  After excluding acute coronary syndrome, would treat the patient for GERD.  Will get a second troponin on the patient, first not elevated.  My independent review the patient chest x-ray shows no pneumonia.  Repeat troponin negative.  Considered admission for the patient, however believe he would benefit more from outpatient follow-up.  Will discharge patient with prescription for famotidine.    Amount and/or Complexity of Data Reviewed Labs: ordered. Radiology: ordered.  Risk OTC drugs. Prescription drug management.           Final Clinical Impression(s) / ED Diagnoses Final diagnoses:  Chest pain, unspecified type    Rx / DC Orders ED Discharge Orders          Ordered    famotidine (PEPCID) 40 MG tablet  Daily        02/26/23 2043              Anders Simmonds T, DO 02/26/23 2050

## 2023-02-26 NOTE — Discharge Instructions (Signed)
While you are in the emergency department, you had labs done to look for signs of injury to your heart.  These test were normal.  Your EKG was normal.  I think that there is a chance that GERD, or heartburn could be contributing to your symptoms.  I prescribed you a medicine called famotidine that you can take each day.  I would like you to call your primary care doctor this week to set up follow-up appointment.  You can discuss whether or not additional cardiac testing is necessary.  Come back to the emergency department if you develop new, or worsening pain in your chest.

## 2023-03-16 IMAGING — CT CT HEAD W/O CM
4 series · 17 of 47 positions shown, 19 images · non-contrast
Comparison: None Available.

CLINICAL DATA: Numbness and stroke-like symptoms, initial encounter



[Series 2: head wo · axial · 0.41mm/px · z∈[+784,+904]mm · 7 of 32 slices shown, 9 images]
[im 4/32  brain]
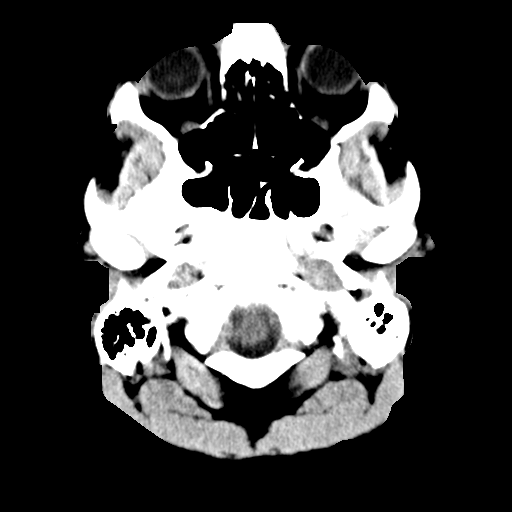
[im 4/32  bone]
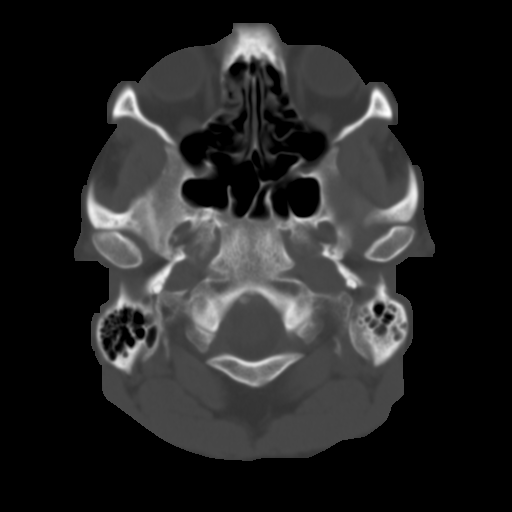
[im 8/32  brain]
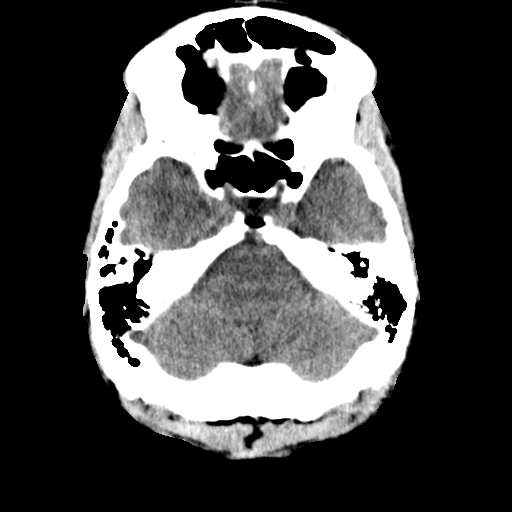
[im 12/32  brain]
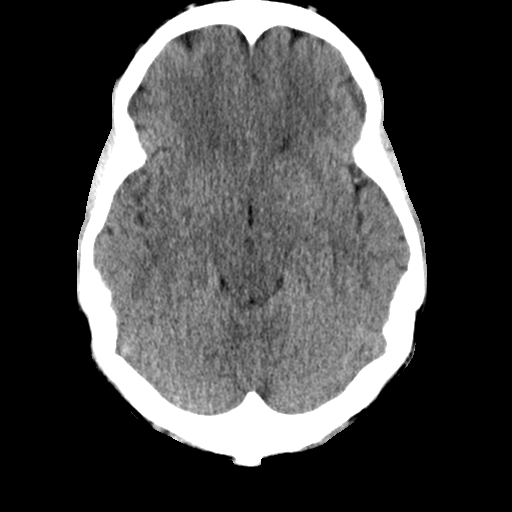
[im 16/32  brain]
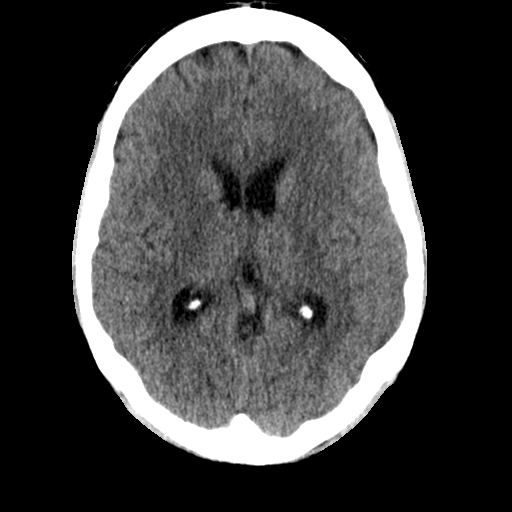
[im 20/32  brain]
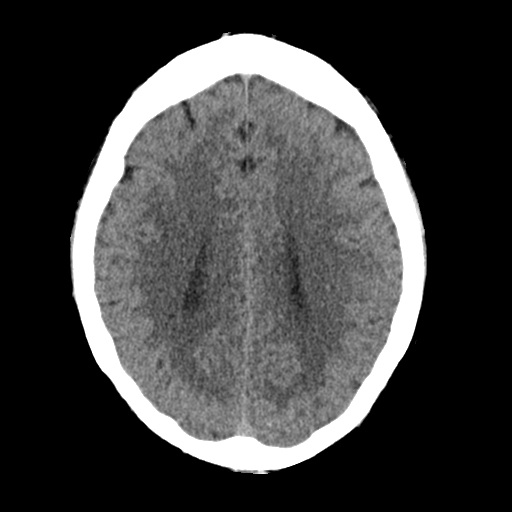
[im 20/32  bone]
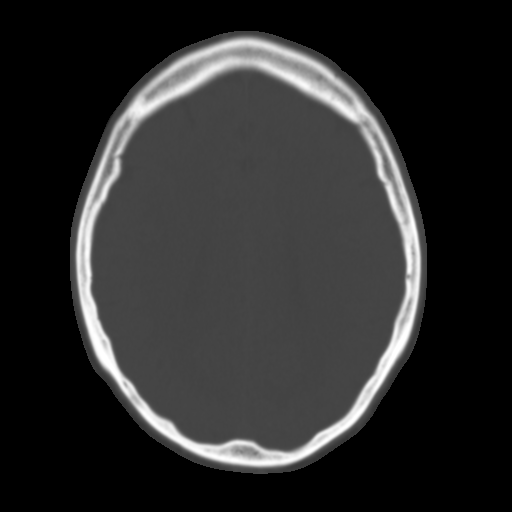
[im 24/32  brain]
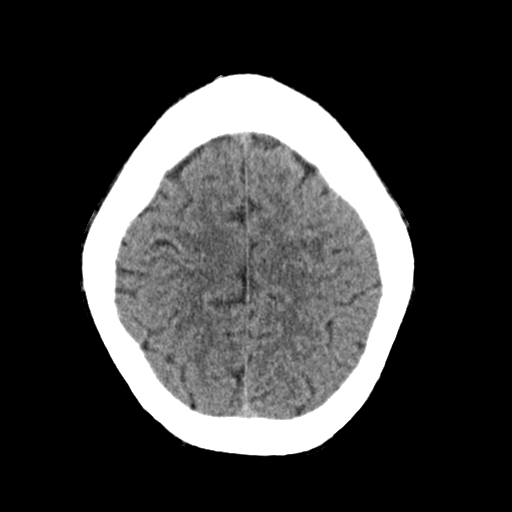
[im 28/32  brain]
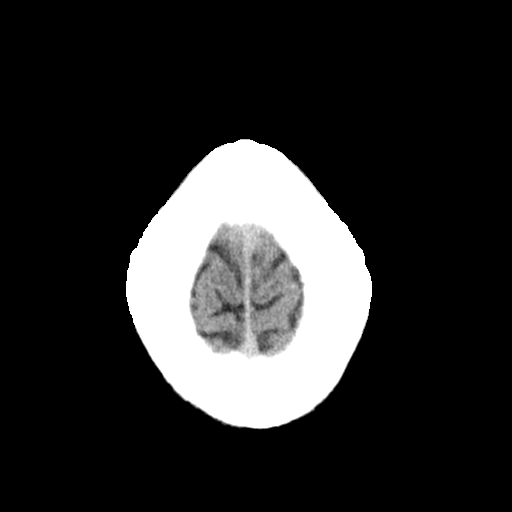

[Series 3: head bone · axial · 0.41mm/px · z∈[+783,+839]mm · 4 of 80 slices shown]
[im 8/80  bone]
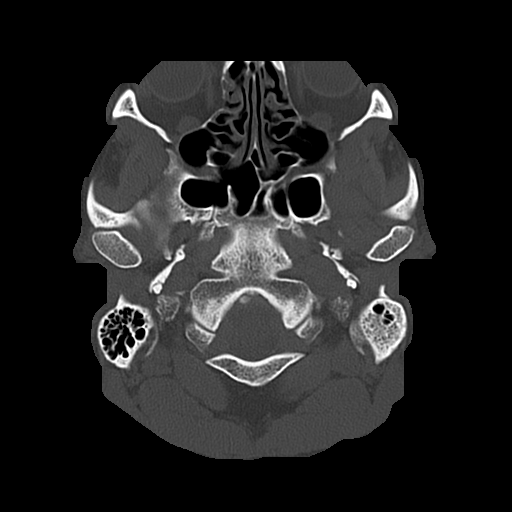
[im 16/80  bone]
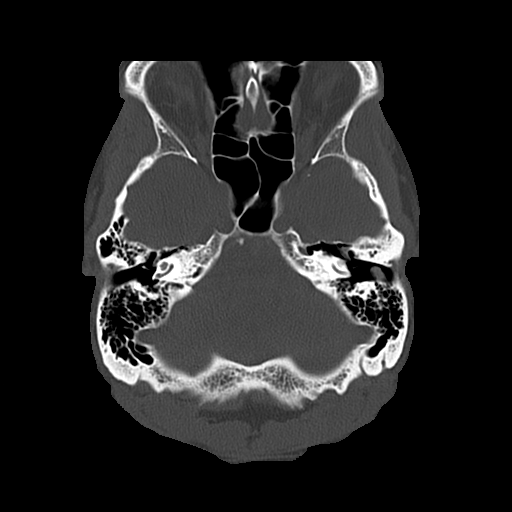
[im 24/80  bone]
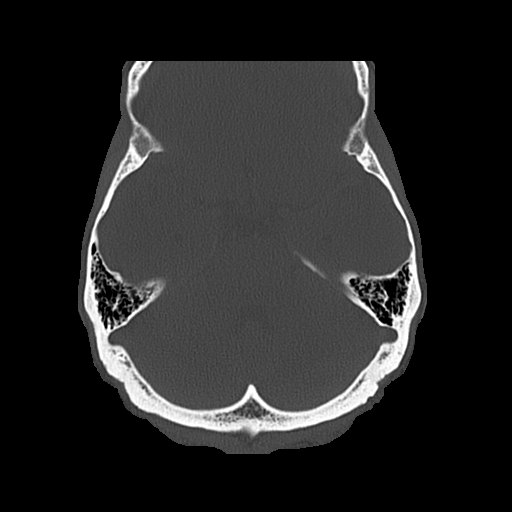
[im 36/80  bone]
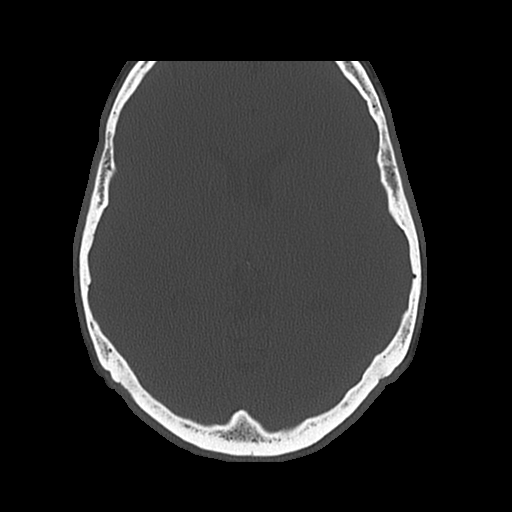

[Series 4: coronal soft · coronal · 0.35mm/px · 3 of 68 slices shown]
[im 23/68  brain]
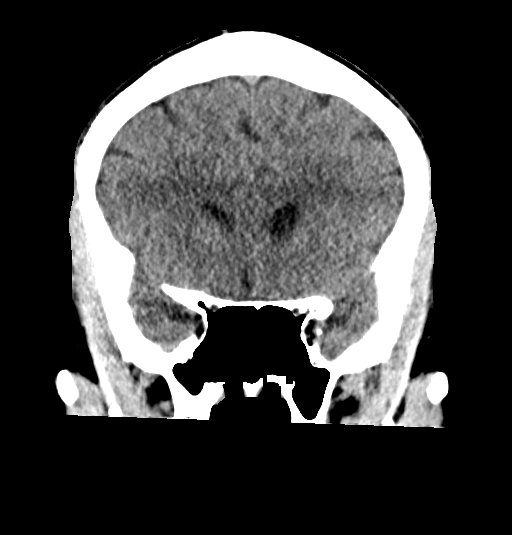
[im 30/68  brain]
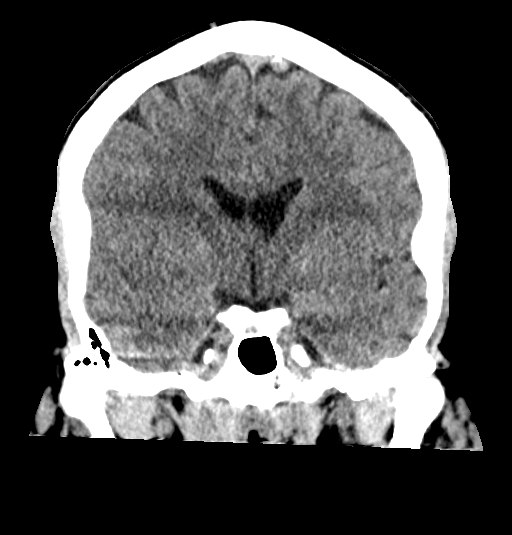
[im 38/68  brain]
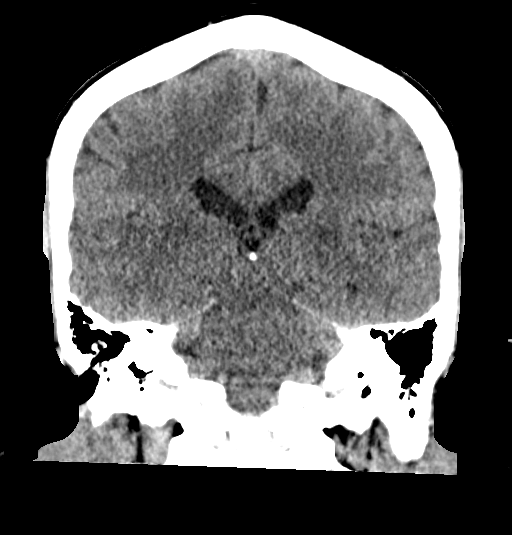

[Series 5: sagittal soft · sagittal · 0.36mm/px · 3 of 60 slices shown]
[im 20/60  brain]
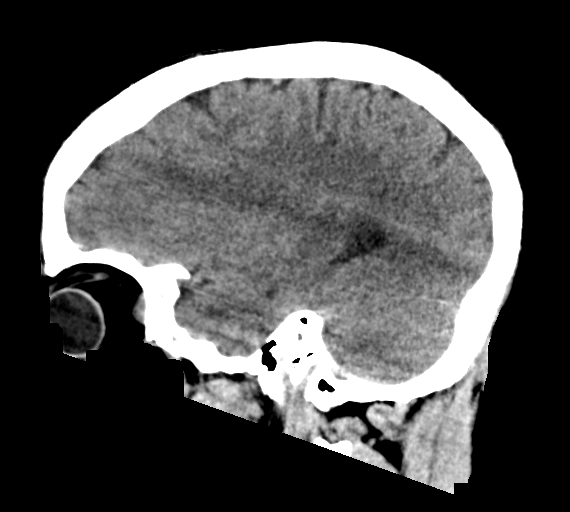
[im 30/60  brain]
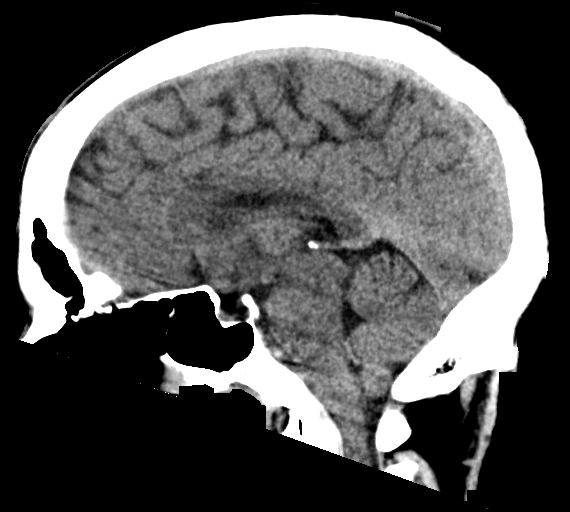
[im 40/60  brain]
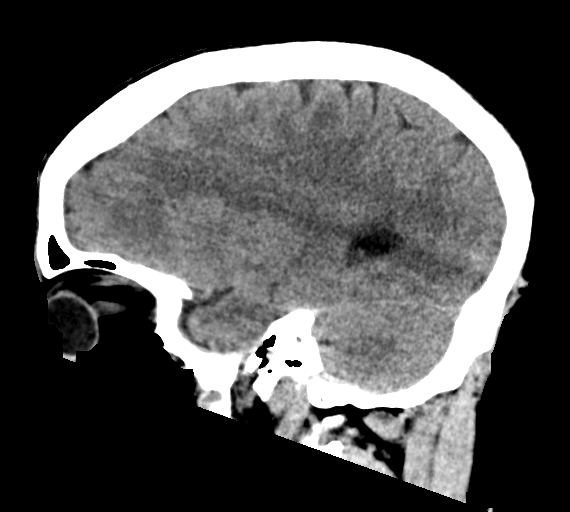

[17 of 47 positions shown; findings below may reference images not displayed]

FINDINGS: Brain: No evidence of acute infarction, hemorrhage, hydrocephalus,
extra-axial collection or mass lesion/mass effect.

Vascular: No hyperdense vessel or unexpected calcification.

Skull: Normal. Negative for fracture or focal lesion.

Sinuses/Orbits: No acute finding.

Other: None.
IMPRESSION: No acute intracranial abnormality noted.

## 2023-04-14 IMAGING — DX DG HAND COMPLETE 3+V*L*
3 series · 3 of 3 positions shown · non-contrast
Comparison: None Available.

CLINICAL DATA: Crush injury.

EXAM:
LEFT HAND - COMPLETE 3+ VIEW

[hand pa]
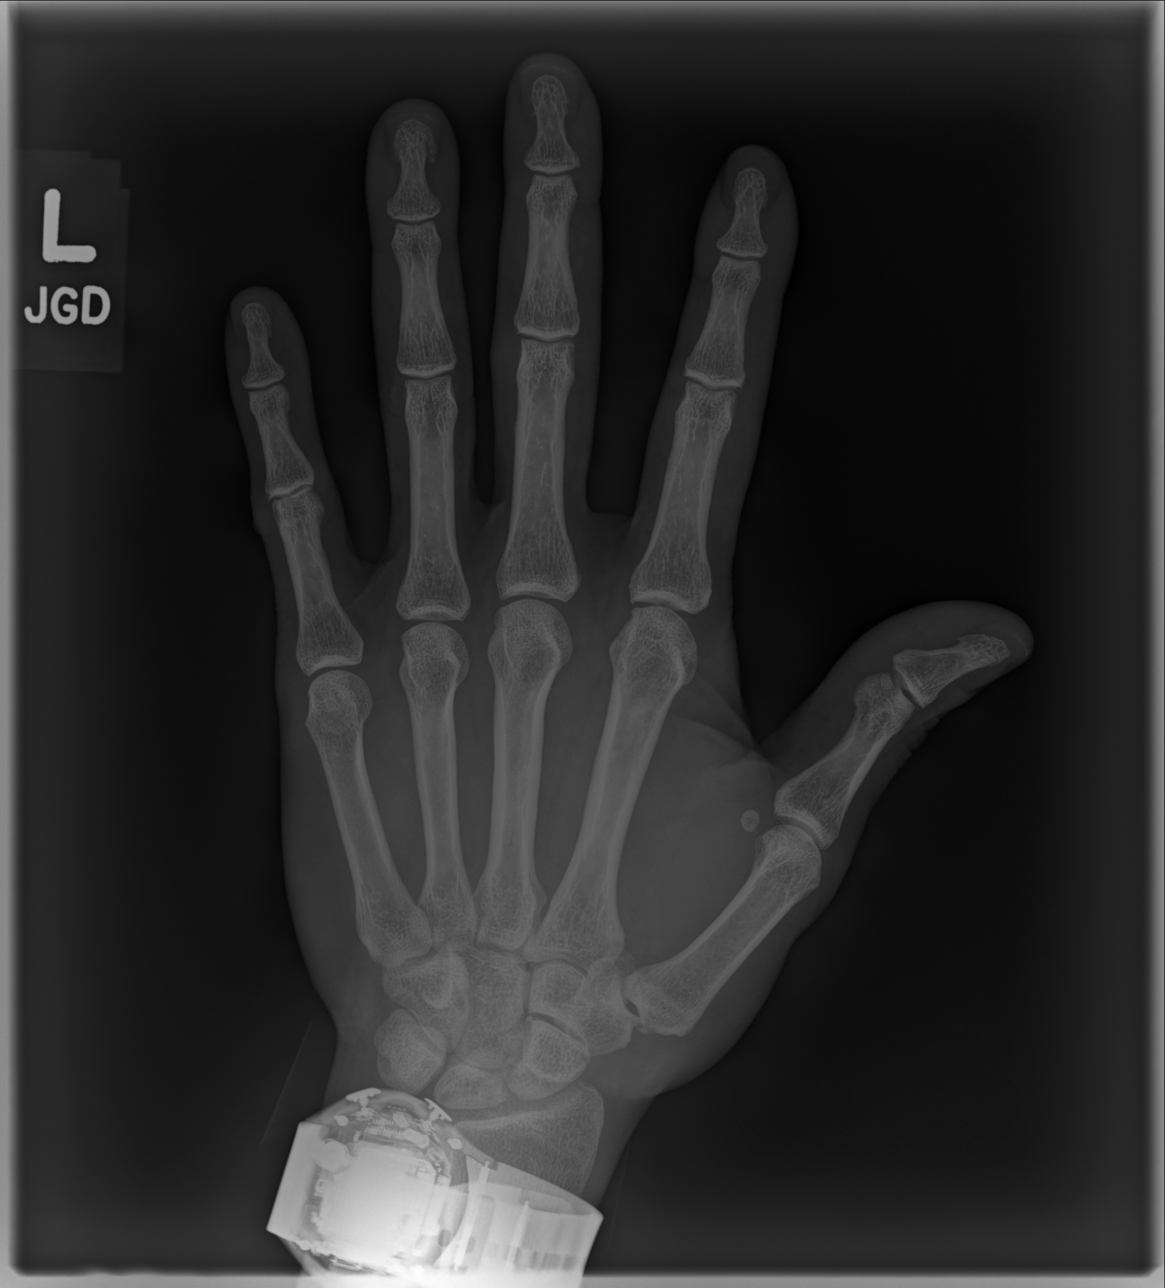

[hand mlo]
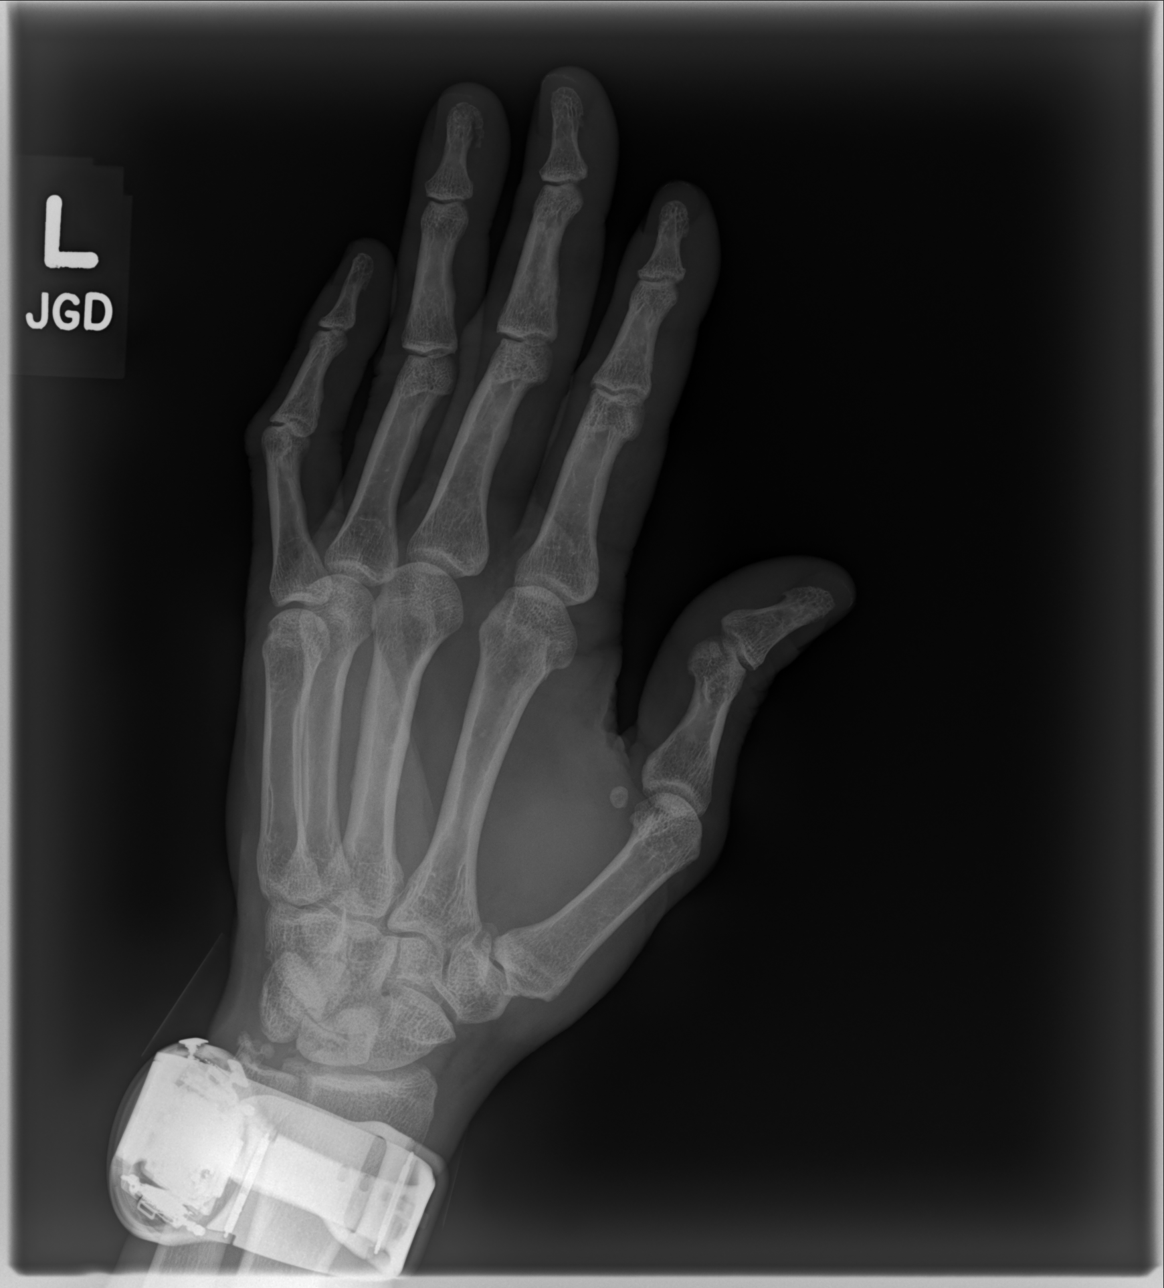

[hand lat]
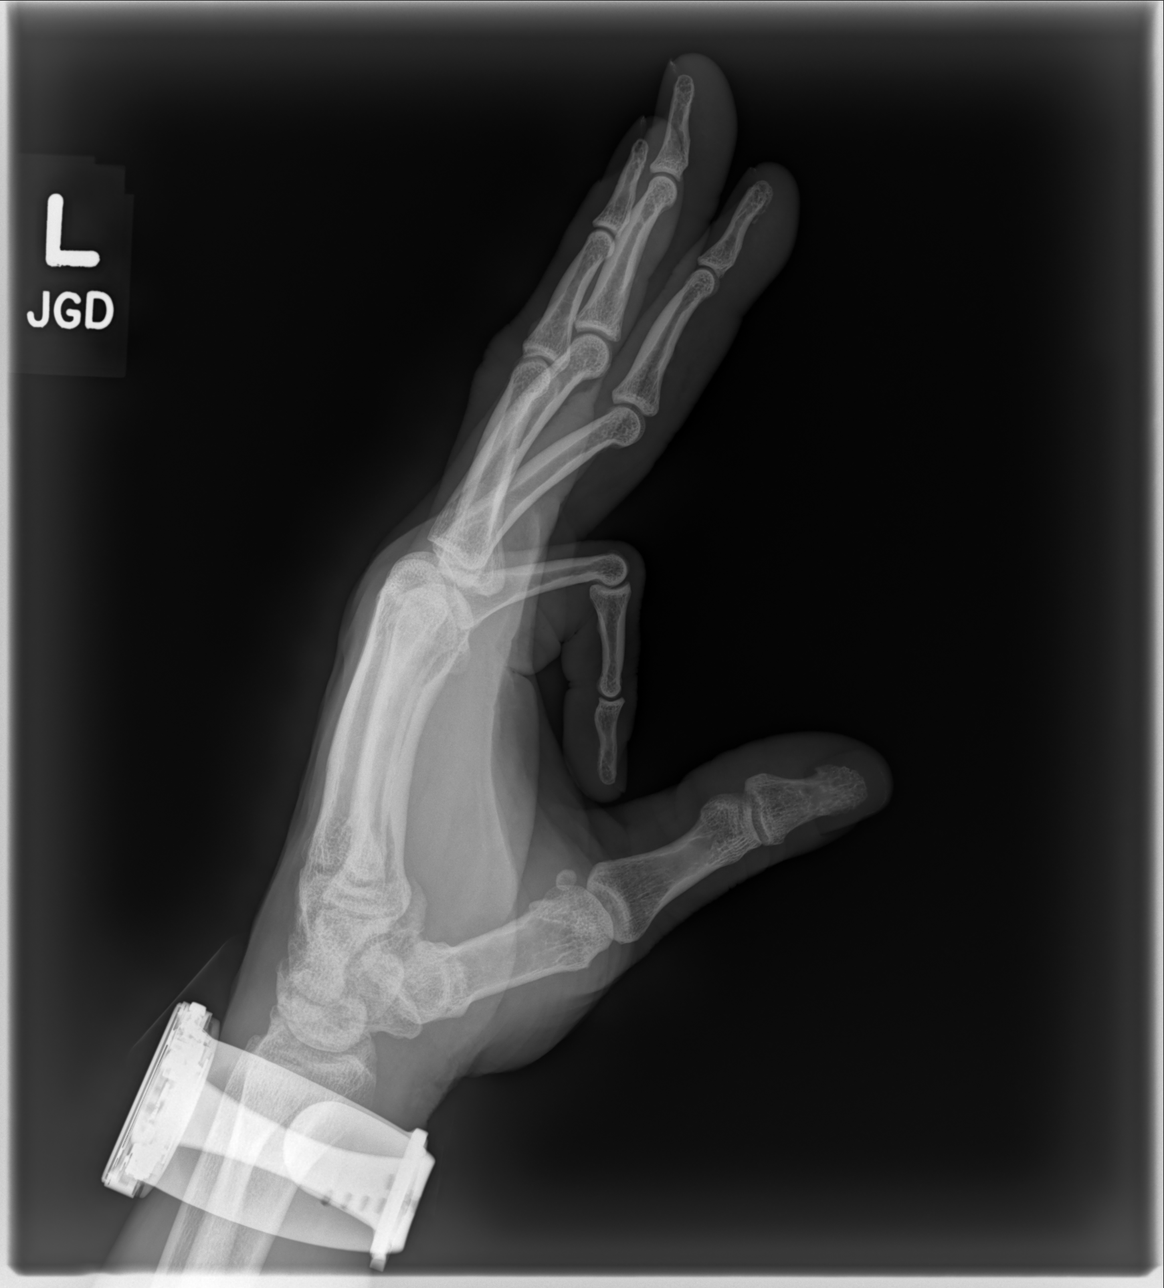

[3 of 3 positions shown; findings below may reference images not displayed]

FINDINGS: There is no evidence for acute fracture or dislocation. There is no
evidence of arthropathy or other focal bone abnormality. There is
likely sequelae from old ulnar styloid fracture. Soft tissues are
unremarkable.
IMPRESSION: No acute bony abnormality.

## 2023-05-12 ENCOUNTER — Encounter (HOSPITAL_BASED_OUTPATIENT_CLINIC_OR_DEPARTMENT_OTHER): Payer: Self-pay | Admitting: Emergency Medicine

## 2023-05-12 ENCOUNTER — Other Ambulatory Visit: Payer: Self-pay

## 2023-05-12 ENCOUNTER — Emergency Department (HOSPITAL_BASED_OUTPATIENT_CLINIC_OR_DEPARTMENT_OTHER)
Admission: EM | Admit: 2023-05-12 | Discharge: 2023-05-12 | Disposition: A | Discharge status: Home / Self Care | Payer: 59 | Attending: Emergency Medicine | Admitting: Emergency Medicine

## 2023-05-12 ENCOUNTER — Emergency Department (HOSPITAL_BASED_OUTPATIENT_CLINIC_OR_DEPARTMENT_OTHER): Payer: 59 | Admitting: Radiology

## 2023-05-12 DIAGNOSIS — M545 Low back pain, unspecified: Secondary | ICD-10-CM | POA: Diagnosis present

## 2023-05-12 MED ORDER — CYCLOBENZAPRINE HCL 10 MG PO TABS: 10.0000 mg | ORAL_TABLET | Freq: Two times a day (BID) | ORAL | 0 refills | Status: AC | PRN

## 2023-05-12 MED ORDER — OXYCODONE-ACETAMINOPHEN 5-325 MG PO TABS
1.0000 | ORAL_TABLET | Freq: Once | ORAL | Status: AC
  Administered 2023-05-12: 1 via ORAL
  Filled 2023-05-12: qty 1

## 2023-05-12 MED ORDER — NAPROXEN 375 MG PO TABS: 375.0000 mg | ORAL_TABLET | Freq: Two times a day (BID) | ORAL | 0 refills | Status: DC

## 2023-05-12 NOTE — Discharge Instructions (Signed)
Take the medications to help with your back pain and muscle spasms.  Follow-up with a primary care doctor to be rechecked if the symptoms did not improve in the next week

## 2023-05-12 NOTE — ED Notes (Signed)
Pt given discharge instructions and reviewed prescriptions. Opportunities given for questions. Pt verbalizes understanding. Madi Bonfiglio R, RN 

## 2023-05-12 NOTE — ED Provider Notes (Signed)
[place] EMERGENCY DEPARTMENT AT [facility] Provider Note   CSN: [id number] Arrival date & time: 05/12/23  1000     History  Chief Complaint  Patient presents with   Back Pain    Richard Shaffer is a 53 y.o. male.   Back Pain    Patient presents ED with complaints of low back pain.  Patient states symptoms started about a week ago.  He does not recall any specific falls or injuries.  The pain is primarily in his lower back.  It does increase when he tries to stand up.  Not having any numbness or weakness.  No difficulty urinating.  Patient has not tried taking anything for the pain.  Today was worsening could not stand without having significant pain so he came to the ED  Home Medications Prior to Admission medications   Medication Sig Start Date End Date Taking? Authorizing Provider  cyclobenzaprine (FLEXERIL) 10 MG tablet Take 1 tablet (10 mg total) by mouth 2 (two) times daily as needed for muscle spasms. 05/12/23  Yes Linwood Dibbles, MD  naproxen (NAPROSYN) 375 MG tablet Take 1 tablet (375 mg total) by mouth 2 (two) times daily. 05/12/23  Yes Linwood Dibbles, MD  famotidine (PEPCID) 40 MG tablet Take 1 tablet (40 mg total) by mouth daily. 02/26/23 03/28/23  Anders Simmonds T, DO  Multiple Vitamins-Minerals (MULTIVITAMIN MEN PO) Take by mouth.    [provider]  predniSONE (DELTASONE) 10 MG tablet Take 2 tablets (20 mg total) by mouth 2 (two) times daily with a meal. 08/26/21   Geoffery Lyons, MD      Allergies    Patient has no known allergies.    Review of Systems   Review of Systems  Musculoskeletal:  Positive for back pain.    Physical Exam Updated Vital Signs BP 105/83   Pulse 61   Temp 97.8 F (36.6 C) (Oral)   Resp 18   Ht 1.905 m (6\' 3" )   Wt 95.3 kg   SpO2 95%   BMI 26.25 kg/m  Physical Exam Vitals and nursing note reviewed.  Constitutional:      General: He is not in acute distress.    Appearance: He is well-developed.  HENT:     Head:  Normocephalic and atraumatic.     Right Ear: External ear normal.     Left Ear: External ear normal.  Eyes:     General: No scleral icterus.       Right eye: No discharge.        Left eye: No discharge.     Conjunctiva/sclera: Conjunctivae normal.  Neck:     Trachea: No tracheal deviation.  Cardiovascular:     Rate and Rhythm: Normal rate.  Pulmonary:     Effort: Pulmonary effort is normal. No respiratory distress.     Breath sounds: No stridor.  Abdominal:     General: There is no distension.     Palpations: There is no mass.     Tenderness: There is no abdominal tenderness. There is no guarding.  Musculoskeletal:        General: Tenderness present. No swelling or deformity.     Cervical back: Neck supple.     Lumbar back: Tenderness present.     Comments: Tenderness palpation lower midline spine and paraspinal region  Skin:    General: Skin is warm and dry.     Findings: No rash.  Neurological:     Mental Status: He is alert. Mental  status is at baseline.     Cranial Nerves: No dysarthria or facial asymmetry.     Motor: No seizure activity.     Comments: Normal strength and sensation bilateral lower extremities     ED Results / Procedures / Treatments   Labs (all labs ordered are listed, but only abnormal results are displayed) Labs Reviewed - No data to display  EKG None  Radiology DG Lumbar Spine Complete Result Date: 05/12/2023 CLINICAL DATA:  Back pain. Low back pain for 1 week gradually worsening. No injury. EXAM: LUMBAR SPINE - COMPLETE 4+ VIEW COMPARISON:  None Available. FINDINGS: Five lumbar type vertebral bodies. Normal alignment of the lumbar spine and facet joints. No vertebral compression deformities. No focal bone lesion or bone destruction. Bone cortex appears intact. Visualized sacrum appears intact. SI joints are not displaced. Mild degenerative changes in the lumbar spine with endplate osteophyte formation. Old right rib fractures. IMPRESSION: Mild  degenerative changes. Normal alignment. No acute displaced fractures identified. Electronically Signed   By: Burman Nieves M.D.   On: 05/12/2023 12:32    Procedures Procedures    Medications Ordered in ED Medications  oxyCODONE-acetaminophen (PERCOCET/ROXICET) 5-325 MG per tablet 1 tablet (1 tablet Oral Given 05/12/23 1218)    ED Course/ Medical Decision Making/ A&P Clinical Course as of 05/12/23 1312  Thu May 12, 2023  1252 Duration of mild degenerative changes no acute finding [JK]    Clinical Course User Index [JK] Linwood Dibbles, MD                                 Medical Decision Making Problems Addressed: Acute low back pain without sciatica, unspecified back pain laterality: acute illness or injury that poses a threat to life or bodily functions  Amount and/or Complexity of Data Reviewed Radiology: ordered and independent interpretation performed.  Risk Prescription drug management.   Patient presented to the ED for evaluation of back pain.  Patient's not having any abdominal pain or other systemic symptoms to suggest referred pain.  He does not have any radicular symptoms or neurologic dysfunction.  Suspect muscle spasm and strain.  X-rays did not show any acute abnormality.  Will discharge home with medications for pain and discomfort.  Recommend outpatient follow-up with PCP if symptoms persist        Final Clinical Impression(s) / ED Diagnoses Final diagnoses:  Acute low back pain without sciatica, unspecified back pain laterality    Rx / DC Orders ED Discharge Orders          Ordered    naproxen (NAPROSYN) 375 MG tablet  2 times daily        05/12/23 1312    cyclobenzaprine (FLEXERIL) 10 MG tablet  2 times daily PRN        05/12/23 1312              Linwood Dibbles, MD 05/12/23 1314

## 2023-05-12 NOTE — ED Triage Notes (Signed)
C/o lower back pain x 1 week. Progressively gotten worse. Denies any injuries to area.

## 2023-06-14 ENCOUNTER — Encounter: Payer: Self-pay | Admitting: Physician Assistant

## 2023-06-14 ENCOUNTER — Ambulatory Visit: Payer: 59 | Admitting: Nurse Practitioner
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
## 2023-07-27 ENCOUNTER — Ambulatory Visit: Payer: 59 | Admitting: Physician Assistant

## 2023-07-27 ENCOUNTER — Encounter: Payer: Self-pay | Admitting: Physician Assistant

## 2023-07-27 ENCOUNTER — Other Ambulatory Visit (INDEPENDENT_AMBULATORY_CARE_PROVIDER_SITE_OTHER)

## 2023-07-27 VITALS — BP 140/94 | HR 72 | Ht 75.0 in | Wt 205.4 lb

## 2023-07-27 DIAGNOSIS — K769 Liver disease, unspecified: Secondary | ICD-10-CM

## 2023-07-27 DIAGNOSIS — R160 Hepatomegaly, not elsewhere classified: Secondary | ICD-10-CM

## 2023-07-27 DIAGNOSIS — F1090 Alcohol use, unspecified, uncomplicated: Secondary | ICD-10-CM

## 2023-07-27 DIAGNOSIS — K5730 Diverticulosis of large intestine without perforation or abscess without bleeding: Secondary | ICD-10-CM | POA: Diagnosis not present

## 2023-07-27 DIAGNOSIS — K5731 Diverticulosis of large intestine without perforation or abscess with bleeding: Secondary | ICD-10-CM

## 2023-07-27 LAB — BASIC METABOLIC PANEL WITH GFR
BUN: 20 mg/dL (ref 6–23)
CO2: 26 meq/L (ref 19–32)
Calcium: 9 mg/dL (ref 8.4–10.5)
Chloride: 103 meq/L (ref 96–112)
Creatinine, Ser: 1.41 mg/dL (ref 0.40–1.50)
GFR: 58.04 mL/min — ABNORMAL LOW (ref >60.00)
Glucose, Bld: 96 mg/dL (ref 70–99)
Potassium: 3.7 meq/L (ref 3.5–5.1)
Sodium: 138 meq/L (ref 135–145)

## 2023-07-27 LAB — CBC WITH DIFFERENTIAL/PLATELET
Basophils Absolute: 0.1 10*3/uL (ref 0.0–0.1)
Basophils Relative: 0.8 % (ref 0.0–3.0)
Eosinophils Absolute: 0.2 10*3/uL (ref 0.0–0.7)
Eosinophils Relative: 2.6 % (ref 0.0–5.0)
HCT: 45.3 % (ref 39.0–52.0)
Hemoglobin: 14.9 g/dL (ref 13.0–17.0)
Lymphocytes Relative: 24.3 % (ref 12.0–46.0)
Lymphs Abs: 1.6 10*3/uL (ref 0.7–4.0)
MCHC: 33 g/dL (ref 30.0–36.0)
MCV: 95.4 fl (ref 78.0–100.0)
Monocytes Absolute: 0.9 10*3/uL (ref 0.1–1.0)
Monocytes Relative: 13.1 % — ABNORMAL HIGH (ref 3.0–12.0)
Neutro Abs: 3.9 10*3/uL (ref 1.4–7.7)
Neutrophils Relative %: 59.2 % (ref 43.0–77.0)
Platelets: 287 10*3/uL (ref 150.0–400.0)
RBC: 4.75 Mil/uL (ref 4.22–5.81)
RDW: 14.4 % (ref 11.5–15.5)
WBC: 6.7 10*3/uL (ref 4.0–10.5)

## 2023-07-27 LAB — PROTIME-INR
INR: 1 ratio (ref 0.8–1.0)
Prothrombin Time: 10.3 s (ref 9.6–13.1)

## 2023-07-27 LAB — HEPATIC FUNCTION PANEL
ALT: 23 U/L (ref 0–53)
AST: 23 U/L (ref 0–37)
Albumin: 4.3 g/dL (ref 3.5–5.2)
Alkaline Phosphatase: 81 U/L (ref 39–117)
Bilirubin, Direct: 0.1 mg/dL (ref 0.0–0.3)
Total Bilirubin: 0.4 mg/dL (ref 0.2–1.2)
Total Protein: 7.5 g/dL (ref 6.0–8.3)

## 2023-07-27 NOTE — Progress Notes (Signed)
 ____________________________________________________________  Attending physician addendum:  Thank you for sending this case to me. I have reviewed the entire note and agree with the plan.   Amada Jupiter, MD  ____________________________________________________________

## 2023-07-27 NOTE — Patient Instructions (Addendum)
 You will be contacted by [facility] Scheduling in the next 2 days to arrange a MRI.  The number on your caller ID will be [phone], please answer when they call.  If you have not heard from them in 2 days please call [phone] to schedule.    Your provider has requested that you go to the basement level for lab work before leaving today. Press "B" on the elevator. The lab is located at the first door on the left as you exit the elevator.   Diverticulosis Diverticulosis is a condition that develops when small pouches (diverticula) form in the wall of the large intestine (colon). The colon is where water is absorbed and stool (feces) is formed. The pouches form when the inside layer of the colon pushes through weak spots in the outer layers of the colon. You may have a few pouches or many of them. The pouches usually do not cause problems unless they become inflamed or infected. When this happens, the condition is called diverticulitis- this is left lower quadrant pain, diarrhea, fever, chills, nausea or vomiting.  If this occurs please call the office or go to the hospital. Sometimes these patches without inflammation can also have painless bleeding associated with them, if this happens please call the office or go to the hospital. Preventing constipation and increasing fiber can help reduce diverticula and prevent complications. Even if you feel you have a high-fiber diet, suggest getting on Benefiber or Cirtracel 2 times daily.

## 2023-07-28 LAB — HEPATITIS A ANTIBODY, TOTAL: Hepatitis A AB,Total: NONREACTIVE

## 2023-07-28 LAB — HEPATITIS B SURFACE ANTIGEN: Hepatitis B Surface Ag: NONREACTIVE

## 2023-07-28 LAB — HEPATITIS B SURFACE ANTIBODY,QUALITATIVE: Hep B S Ab: NONREACTIVE

## 2023-07-28 LAB — HEPATITIS C ANTIBODY: Hepatitis C Ab: NONREACTIVE

## 2023-08-01 ENCOUNTER — Ambulatory Visit (HOSPITAL_COMMUNITY)
Admission: RE | Admit: 2023-08-01 | Discharge: 2023-08-01 | Disposition: A | Discharge status: Home / Self Care | Source: Ambulatory Visit | Attending: Physician Assistant | Admitting: Physician Assistant

## 2023-08-01 DIAGNOSIS — K769 Liver disease, unspecified: Secondary | ICD-10-CM | POA: Diagnosis present

## 2023-08-01 MED ORDER — GADOBUTROL 1 MMOL/ML IV SOLN
10.0000 mL | Freq: Once | INTRAVENOUS | Status: AC | PRN
  Administered 2023-08-01: 10 mL via INTRAVENOUS

## 2023-09-16 ENCOUNTER — Emergency Department (HOSPITAL_COMMUNITY)

## 2023-09-16 ENCOUNTER — Emergency Department (HOSPITAL_COMMUNITY)
Admission: EM | Admit: 2023-09-16 | Discharge: 2023-09-16 | Disposition: A | Discharge status: Home / Self Care | Attending: Emergency Medicine | Admitting: Emergency Medicine

## 2023-09-16 ENCOUNTER — Other Ambulatory Visit: Payer: Self-pay

## 2023-09-16 DIAGNOSIS — I509 Heart failure, unspecified: Secondary | ICD-10-CM | POA: Insufficient documentation

## 2023-09-16 DIAGNOSIS — R079 Chest pain, unspecified: Secondary | ICD-10-CM | POA: Diagnosis present

## 2023-09-16 DIAGNOSIS — E119 Type 2 diabetes mellitus without complications: Secondary | ICD-10-CM | POA: Insufficient documentation

## 2023-09-16 LAB — COMPREHENSIVE METABOLIC PANEL WITH GFR
ALT: 34 U/L (ref 0–44)
AST: 32 U/L (ref 15–41)
Albumin: 3.5 g/dL (ref 3.5–5.0)
Alkaline Phosphatase: 53 U/L (ref 38–126)
Anion gap: 8 (ref 5–15)
BUN: 19 mg/dL (ref 6–20)
CO2: 25 mmol/L (ref 22–32)
Calcium: 8.4 mg/dL — ABNORMAL LOW (ref 8.9–10.3)
Chloride: 103 mmol/L (ref 98–111)
Creatinine, Ser: 1.24 mg/dL (ref 0.61–1.24)
GFR, Estimated: >60 mL/min (ref >60)
Glucose, Bld: 89 mg/dL (ref 70–99)
Potassium: 3.4 mmol/L — ABNORMAL LOW (ref 3.5–5.1)
Sodium: 136 mmol/L (ref 135–145)
Total Bilirubin: 0.5 mg/dL (ref 0.0–1.2)
Total Protein: 6.6 g/dL (ref 6.5–8.1)

## 2023-09-16 LAB — CBC
HCT: 41.6 % (ref 39.0–52.0)
Hemoglobin: 13.9 g/dL (ref 13.0–17.0)
MCH: 31.4 pg (ref 26.0–34.0)
MCHC: 33.4 g/dL (ref 30.0–36.0)
MCV: 93.9 fL (ref 80.0–100.0)
Platelets: 287 10*3/uL (ref 150–400)
RBC: 4.43 MIL/uL (ref 4.22–5.81)
RDW: 14.3 % (ref 11.5–15.5)
WBC: 7.2 10*3/uL (ref 4.0–10.5)
nRBC: 0 % (ref 0.0–0.2)

## 2023-09-16 LAB — TROPONIN I (HIGH SENSITIVITY)
Troponin I (High Sensitivity): 5 ng/L (ref <18)
Troponin I (High Sensitivity): 5 ng/L (ref <18)

## 2023-09-16 MED ORDER — KETOROLAC TROMETHAMINE 15 MG/ML IJ SOLN
15.0000 mg | Freq: Once | INTRAMUSCULAR | Status: AC
  Administered 2023-09-16: 15 mg via INTRAVENOUS
  Filled 2023-09-16: qty 1

## 2023-09-16 MED ORDER — PANTOPRAZOLE SODIUM 40 MG PO TBEC: 40.0000 mg | DELAYED_RELEASE_TABLET | Freq: Every day | ORAL | 0 refills | Status: DC

## 2023-09-16 NOTE — ED Triage Notes (Signed)
 Patient via EMS for eval of central chest pain and sob since 1800 last night. 324 ASA and 2 nitroglycerin given by EMS and pain improved. Also reports a lot of burping. Hx of GERD but not medicated for same.

## 2023-09-16 NOTE — Discharge Instructions (Addendum)
 The test today in the ED were reassuring.  Follow-up with your primary care doctor.  I have also placed a referral to a cardiologist for further evaluation.  Return to the ED as needed for recurrent episodes.

## 2023-09-16 NOTE — ED Notes (Addendum)
 Patient resting comfortably at this time.

## 2023-09-16 NOTE — ED Notes (Signed)
 Patient transported to X-ray

## 2023-09-16 NOTE — ED Notes (Signed)
 CCMD called and notified

## 2023-09-16 NOTE — ED Provider Notes (Signed)
 [place] EMERGENCY DEPARTMENT AT [facility] Provider Note   CSN: [id number] Arrival date & time: 09/16/23  4098     History  Chief Complaint  Patient presents with   Chest Pain    Richard Shaffer is a 53 y.o. male.   Chest Pain    Patient presents to the ED for evaluation of chest pain.  Patient states he started having some symptoms yesterday when he was at work.  He was having some pain in the center of his chest.  He thought he might have been dehydrated.  He drank some water and his symptoms got better.  Patient states today he started having pain again.  He noticed it was worse after a coughing spell and taking a deep breath.  Patient states he also felt his right arm go numb after the pain started.  He denies any fevers.  No shortness of breath.  No leg swelling.  There is a family history of heart disease but patient does not have a history of heart disease diabetes hypercholesterolemia or smoking history.  Patient does not have history of PE or DVT  Home Medications Prior to Admission medications   Medication Sig Start Date End Date Taking? Authorizing Provider  pantoprazole  (PROTONIX ) 40 MG tablet Take 1 tablet (40 mg total) by mouth daily. 09/16/23  Yes Trish Furl, MD  cyclobenzaprine  (FLEXERIL ) 10 MG tablet Take 1 tablet (10 mg total) by mouth 2 (two) times daily as needed for muscle spasms. 05/12/23   Trish Furl, MD  Multiple Vitamins-Minerals (MULTIVITAMIN MEN PO) Take by mouth.    [provider]  naproxen  (NAPROSYN ) 375 MG tablet Take 1 tablet (375 mg total) by mouth 2 (two) times daily. 05/12/23   Trish Furl, MD      Allergies    Patient has no known allergies.    Review of Systems   Review of Systems  Cardiovascular:  Positive for chest pain.    Physical Exam Updated Vital Signs BP (!) 123/92   Pulse 65   Temp 98.1 F (36.7 C) (Oral)   Resp 15   Ht 1.905 m (6\' 3" )   Wt 95.3 kg   SpO2 97%   BMI 26.25 kg/m  Physical  Exam Vitals and nursing note reviewed.  Constitutional:      General: He is not in acute distress.    Appearance: He is well-developed.  HENT:     Head: Normocephalic and atraumatic.     Right Ear: External ear normal.     Left Ear: External ear normal.  Eyes:     General: No scleral icterus.       Right eye: No discharge.        Left eye: No discharge.     Conjunctiva/sclera: Conjunctivae normal.  Neck:     Trachea: No tracheal deviation.  Cardiovascular:     Rate and Rhythm: Normal rate and regular rhythm.  Pulmonary:     Effort: Pulmonary effort is normal. No respiratory distress.     Breath sounds: Normal breath sounds. No stridor. No wheezing or rales.  Abdominal:     General: Bowel sounds are normal. There is no distension.     Palpations: Abdomen is soft.     Tenderness: There is no abdominal tenderness. There is no guarding or rebound.  Musculoskeletal:        General: No tenderness or deformity.     Cervical back: Neck supple.  Skin:    General: Skin is  warm and dry.     Findings: No rash.  Neurological:     General: No focal deficit present.     Mental Status: He is alert.     Cranial Nerves: No cranial nerve deficit, dysarthria or facial asymmetry.     Sensory: No sensory deficit.     Motor: No abnormal muscle tone or seizure activity.     Coordination: Coordination normal.  Psychiatric:        Mood and Affect: Mood normal.     ED Results / Procedures / Treatments   Labs (all labs ordered are listed, but only abnormal results are displayed) Labs Reviewed  COMPREHENSIVE METABOLIC PANEL WITH GFR - Abnormal; Notable for the following components:      Result Value   Potassium 3.4 (*)    Calcium 8.4 (*)    All other components within normal limits  CBC  TROPONIN I (HIGH SENSITIVITY)  TROPONIN I (HIGH SENSITIVITY)    EKG EKG Interpretation Date/Time:  Friday Sep 16 2023 10:01:14 EDT Ventricular Rate:  75 PR Interval:  169 QRS Duration:  86 QT  Interval:  362 QTC Calculation: 405 R Axis:   64  Text Interpretation: Sinus rhythm No significant change since last tracing Confirmed by Trish Furl [id number]) on 09/16/2023 12:02:36 PM  Radiology DG Chest 2 View Result Date: 09/16/2023 CLINICAL DATA:  Chest pain EXAM: CHEST - 2 VIEW COMPARISON:  02/26/2023 FINDINGS: The heart size and mediastinal contours are within normal limits. Both lungs are clear. The visualized skeletal structures are unremarkable. IMPRESSION: No active cardiopulmonary disease. Electronically Signed   By: Melven Stable.  Shick M.D.   On: 09/16/2023 12:10    Procedures Procedures    Medications Ordered in ED Medications  ketorolac  (TORADOL ) 15 MG/ML injection 15 mg (15 mg Intravenous Given 09/16/23 1014)    ED Course/ Medical Decision Making/ A&P Clinical Course as of 09/16/23 1413  Fri Sep 16, 2023  1124 CBC Normal [JK]  1202 Troponin I (High Sensitivity) Normal [JK]  1202 Comprehensive metabolic panel(!) No significant abnormalities [JK]  1411 Troponin I (High Sensitivity) Troponin normal [JK]    Clinical Course User Index [JK] Trish Furl, MD             HEART Score: 2                    Medical Decision Making Amount and/or Complexity of Data Reviewed Labs: ordered. Decision-making details documented in ED Course. Radiology: ordered.  Risk Prescription drug management.   She presented to the ED for evaluation of chest pain.  ED workup reassuring.  No evidence of pneumonia or pneumothorax.  Patient is low risk for PE.  He is not have any shortness of breath.  Low suspicion for PE.   Cardiac workup reassuring.  Serial troponins are normal.  Patient overall low risk.   Will try course of antacids. Evaluation and diagnostic testing in the emergency department does not suggest an emergent condition requiring admission or immediate intervention beyond what has been performed at this time.  The patient is safe for discharge and has been instructed to return  immediately for worsening symptoms, change in symptoms or any other concerns.         Final Clinical Impression(s) / ED Diagnoses Final diagnoses:  Chest pain, unspecified type    Rx / DC Orders ED Discharge Orders          Ordered    Ambulatory referral to Cardiology  Comments: If you have not heard from the Cardiology office within the next 72 hours please call [phone].   09/16/23 1413    pantoprazole (PROTONIX) 40 MG tablet  Daily        09/16/23 1413              Trish Furl, MD 09/16/23 1414

## 2023-10-28 ENCOUNTER — Ambulatory Visit (HOSPITAL_COMMUNITY): Admission: EM | Admit: 2023-10-28 | Discharge: 2023-10-28 | Disposition: A | Discharge status: Home / Self Care

## 2023-10-28 ENCOUNTER — Encounter (HOSPITAL_COMMUNITY): Payer: Self-pay

## 2023-10-28 DIAGNOSIS — H1033 Unspecified acute conjunctivitis, bilateral: Secondary | ICD-10-CM | POA: Diagnosis not present

## 2023-10-28 MED ORDER — OFLOXACIN 0.3 % OP SOLN: 1.0000 [drp] | Freq: Four times a day (QID) | OPHTHALMIC | 0 refills | Status: DC

## 2023-10-28 MED ORDER — TETRACAINE HCL 0.5 % OP SOLN
OPHTHALMIC | Status: AC
  Filled 2023-10-28: qty 4

## 2023-10-28 MED ORDER — FLUORESCEIN SODIUM 1 MG OP STRP
ORAL_STRIP | OPHTHALMIC | Status: AC
  Filled 2023-10-28: qty 1

## 2023-10-28 NOTE — ED Triage Notes (Signed)
 Patient here today with c/o bilat eye redness, discomfort, and drainage that started today.

## 2023-10-28 NOTE — Discharge Instructions (Signed)
 As we discussed, I think your symptoms are related to a viral infection, however, since you are having significant drainage we are going to cover for bacteria as well.  Keep your hands clean and avoid touching the tip of the medication bottle to the eye to avoid contaminating the medicine.  Use ofloxacin  drops in each eye 4 times daily for the next week.  You can use a warm compress to keep the eye clean as well as lubricating eyedrops/artificial tears for additional symptom relief.  If your symptoms or not improving within a few days please follow-up with ophthalmology.  If anything worsens and you have vision change, fever, nausea, vomiting, eye pain you need to go to the ER.
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
## 2023-12-22 ENCOUNTER — Encounter: Payer: Self-pay | Admitting: General Practice

## 2023-12-22 ENCOUNTER — Ambulatory Visit: Attending: Cardiology | Admitting: Physician Assistant

## 2023-12-22 VITALS — BP 110/74 | HR 85 | Ht 75.0 in | Wt 200.0 lb

## 2023-12-22 DIAGNOSIS — Z8249 Family history of ischemic heart disease and other diseases of the circulatory system: Secondary | ICD-10-CM | POA: Diagnosis not present

## 2023-12-22 DIAGNOSIS — R0602 Shortness of breath: Secondary | ICD-10-CM

## 2023-12-22 DIAGNOSIS — R079 Chest pain, unspecified: Secondary | ICD-10-CM | POA: Diagnosis not present

## 2023-12-22 MED ORDER — PANTOPRAZOLE SODIUM 40 MG PO TBEC: 40.0000 mg | DELAYED_RELEASE_TABLET | Freq: Every day | ORAL | 3 refills | Status: AC

## 2023-12-22 MED ORDER — ISOSORBIDE MONONITRATE ER 30 MG PO TB24: 15.0000 mg | ORAL_TABLET | Freq: Every day | ORAL | 3 refills | Status: AC

## 2023-12-22 NOTE — Progress Notes (Signed)
 Cardiology Office Note:  .   Date:  12/22/2023  ID:  Lonni Meier, DOB [birth year], MRN [id number] PCP: [facility]  [facility] Providers Cardiologist:  Lonni Cash, MD {  History of Present Illness: .   Mayfield Schoene is a 53 y.o. male  with PMHx of atypical chest pain who reports to [facility] office for follow up.   Seen in the ED at [facility] on 07/30/22 with substernal chest pressure. EKG without ischemic changes. Troponin negative x 2. D-dimer negative. Chest x-ray with no active disease. His pain was not felt to be cardiac related.   ECHO 10/19/2022: EF 55-60%, G1DD, trivial MVR  Last seen in heartcare 09/15/2023 with Dr. Cash for new patient evaluation for chest pain. Reports waking up on this same day with SOB, cough and chest tightness/squeezing but no recurrent pain since then. EKG w/o acute ischemic changes. Ordered ECHO, which is not completed and not pending. Not on any cardiac medications.   Recent ED visit 09/16/2023 for chest pain. First episode occured while at work in center of chest, which improved with drinking water. Second episode was worse with cough and deep breaths associated with numbness in right arm. Serial troponin were normal. Suspected low risk for cardiac cause. Discharged on Protonix .   Today, CP and SOB remains the same with no change in frequency and intensity.  Reports CP located midsternum, described as squeezing, 7/10, last very 10 to 20 minutes, occurring 1-2 times per month with no specific triggers.  Associated with numbness in the left arm, lightheadedness, hot and clammy.  Denies diaphoresis, N/B, palpitations, edema, dizziness, syncope.  Notes SOB is like gasping for air. Patient never started Protonix .  Plans to follow-up with PCP about acid reflux. Reports compliance with medications.  Notes low-sodium diet with mix of healthy and unhealthy meals.  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  He has a 60-year-old son that he has no difficulties keeping up with.  Denies tobacco use and drug use.  Reports during weekdays drinking 2 beers and on weekends sometimes up to 4 beers.  Reports significant cardiac family history including myocardial infarction, HLD on both maternal and paternal sides.  ROS: 10 point review of system has been reviewed and considered negative except ones been listed in the HPI.   Studies Reviewed: SABRA   EKG Interpretation Date/Time:  Thursday December 22 2023 13:38:31 EDT Ventricular Rate:  85 PR Interval:  152 QRS Duration:  82 QT Interval:  354 QTC Calculation: 421 R Axis:   68  Text Interpretation: Normal sinus rhythm Nonspecific T wave abnormality When compared with ECG of 16-Sep-2023 10:01, PREVIOUS ECG IS PRESENT Confirmed by Sheron Hallmark ([id number]) on 12/22/2023 2:23:54 PM   ECHO 10/19/2022 IMPRESSIONS   1. Left ventricular ejection fraction, by estimation, is 55 to 60%. The left ventricle has normal function. The left ventricle has no regional wall motion abnormalities. Left ventricular diastolic parameters are consistent with Grade I diastolic  dysfunction (impaired relaxation). The average left ventricular global longitudinal strain is -17.9 %. The global longitudinal strain is normal.   2. Right ventricular systolic function is normal. The right ventricular size is normal. There is normal pulmonary artery systolic pressure.   3. The mitral valve is normal in structure. Trivial mitral valve  regurgitation. No evidence of mitral stenosis.   4. The aortic valve is tricuspid. Aortic valve regurgitation is not visualized. No aortic stenosis is present.   5. The  inferior vena cava is normal in size with greater than 50%  respiratory variability, suggesting right atrial pressure of 3 mmHg.   Physical Exam:   VS:  BP 110/74 (BP Location: Left Arm, Cuff Size: Normal)   Pulse 85   Ht 6' 3 (1.905 m)   Wt 200 lb (90.7 kg)   SpO2 94%   BMI 25.00  kg/m    Wt Readings from Last 3 Encounters:  12/22/23 200 lb (90.7 kg)  09/16/23 210 lb (95.3 kg)  07/27/23 205 lb 6.4 oz (93.2 kg)    GEN: Well nourished, well developed in no acute distress while sitting in chair.  NECK: No JVD; No carotid bruits CARDIAC: RRR, no murmurs, rubs, gallops RESPIRATORY:  Clear to auscultation without rales, wheezing or rhonchi  ABDOMEN: Soft, non-tender, non-distended EXTREMITIES:  No edema; No deformity   ASSESSMENT AND PLAN: .   Atypical Chest pain  SOB Family history of premature CAD ED visit 07/30/2022: His pain was not felt to be cardiac related. ECHO 10/19/2022: EF 55-60%, G1DD, trivial MVR First seen by cardio in OV 09/15/2023: Reported waking up with SOB, cough and chest tightness/squeezing. Ordered ECHO, which is not completed or pending. Not on any cardiac medications.  ED visit 09/16/2023: Suspected low risk for cardiac cause. Discharged on Protonix .  CP and SOB remains the same with no change in frequency and intensity.  No exertional chest pain.  Associated with SOB, numbness in the left arm, lightheadedness, hot and clammy. EKG NSR without acute ischemic changes. Less likely ACS with no ischemic changes on EKG, however presentation has mixed features and significant cardiac family history of MI and HLD. Request labs from PCP ([facility]).  Order echo and exercise Myoview.  Patient is aware if not able to achieve target heart rate will need to switch over to Lexiscan. Order Imdur  15 mg daily.  If CP persist without any dizziness or lightheadedness then can increase Imdur  to 30 mg daily. Will reorder Protonix  for acid reflux.  Encouraged to follow-up with PCP.   Informed Consent   Shared Decision Making/Informed Consent{ The risks [chest pain, shortness of breath, cardiac arrhythmias, dizziness, blood pressure fluctuations, myocardial infarction, stroke/transient ischemic attack, nausea, vomiting, allergic reaction, radiation exposure, metallic taste  sensation and life-threatening complications (estimated to be 1 in 10,000)], benefits (risk stratification, diagnosing coronary artery disease, treatment guidance) and alternatives of a nuclear stress test were discussed in detail with Mr. Alejandro and he agrees to proceed.  Dispo:  Follow up in 2 month after ECHO and stress test.   Signed, Lorette CINDERELLA Kapur, PA-C

## 2023-12-22 NOTE — Addendum Note (Signed)
 Addended by: GORDON RONAL SQUIBB on: 12/22/2023 06:20 PM   Modules accepted: Orders

## 2023-12-22 NOTE — Patient Instructions (Signed)
 Medication Instructions:  START Isosorbide  Mononitrate (Imdur ) 15 mg once daily  *If you need a refill on your cardiac medications before your next appointment, please call your pharmacy*  Lab Work: None ordered today.  Please have labs from your primary care provider sent to our office. Our fax number is [phone].  If you have labs (blood work) drawn today and your tests are completely normal, you will receive your results only by: MyChart Message (if you have MyChart) OR A paper copy in the mail If you have any lab test that is abnormal or we need to change your treatment, we will call you to review the results.  Testing/Procedures: Your physician has requested that you have en exercise stress myoview. For further information please visit [email or web address] Please follow instruction sheet, as given.  Your physician has requested that you have an echocardiogram. Echocardiography is a painless test that uses sound waves to create images of your heart. It provides your doctor with information about the size and shape of your heart and how well your heart's chambers and valves are working. This procedure takes approximately one hour. There are no restrictions for this procedure. Please do NOT wear cologne, perfume, aftershave, or lotions (deodorant is allowed). Please arrive 15 minutes prior to your appointment time.  Please note: We ask at that you not bring children with you during ultrasound (echo/ vascular) testing. Due to room size and safety concerns, children are not allowed in the ultrasound rooms during exams. Our front office staff cannot provide observation of children in our lobby area while testing is being conducted. An adult accompanying a patient to their appointment will only be allowed in the ultrasound room at the discretion of the ultrasound technician under special circumstances. We apologize for any inconvenience.   Follow-Up: At [facility], you and  your health needs are our priority.  As part of our continuing mission to provide you with exceptional heart care, our providers are all part of one team.  This team includes your primary Cardiologist (physician) and Advanced Practice Providers or APPs (Physician Assistants and Nurse Practitioners) who all work together to provide you with the care you need, when you need it.  Your next appointment:   6 week(s)  Provider:   Dena Kapur, PA-C or Josefa Beauvais, NP  We recommend signing up for the patient portal called MyChart.  Sign up information is provided on this After Visit Summary.  MyChart is used to connect with patients for Virtual Visits (Telemedicine).  Patients are able to view lab/test results, encounter notes, upcoming appointments, etc.  Non-urgent messages can be sent to your provider as well.   To learn more about what you can do with MyChart, go to [email or web address].   Other Instructions Exercise Myoview (Stress Test) Instructions  Please arrive 15 minutes prior to your appointment time for registration and insurance purposes.   The test will take approximately 3 to 4 hours to complete; you may bring reading material.  If someone comes with you to your appointment, they will need to remain in the main lobby due to limited space in the testing area. **If you are pregnant or breastfeeding, please notify the nuclear lab prior to your appointment**   How to prepare for your Myocardial Perfusion Test: Do not eat or drink 3 hours prior to your test, except you may have water. Do not consume products containing caffeine (regular or decaffeinated) 12 hours prior to your test. (ex: coffee, chocolate, sodas, tea). Do  bring a list of your current medications with you.  If not listed below, you may take your medications as normal. Do wear comfortable clothes (no dresses or overalls) and walking shoes, tennis shoes preferred (No heels or open toe shoes are allowed). Do NOT wear  cologne, perfume, aftershave, or lotions (deodorant is allowed). If these instructions are not followed, your test will have to be rescheduled.   Please report to [address] [place], [place] [address] for your test.  If you have questions or concerns about your appointment, you can call the Nuclear Lab at [phone].   If you cannot keep your appointment, please provide 24 hours notification to the Nuclear Lab, to avoid a possible $50 charge to your account.

## 2023-12-23 ENCOUNTER — Encounter (HOSPITAL_COMMUNITY): Payer: Self-pay

## 2023-12-23 ENCOUNTER — Emergency Department (HOSPITAL_COMMUNITY)
Admission: EM | Admit: 2023-12-23 | Discharge: 2023-12-23 | Disposition: A | Discharge status: Home / Self Care | Attending: Emergency Medicine | Admitting: Emergency Medicine

## 2023-12-23 ENCOUNTER — Other Ambulatory Visit: Payer: Self-pay

## 2023-12-23 ENCOUNTER — Emergency Department (HOSPITAL_COMMUNITY)

## 2023-12-23 DIAGNOSIS — R072 Precordial pain: Secondary | ICD-10-CM | POA: Insufficient documentation

## 2023-12-23 DIAGNOSIS — R0602 Shortness of breath: Secondary | ICD-10-CM | POA: Insufficient documentation

## 2023-12-23 LAB — BASIC METABOLIC PANEL WITH GFR
Anion gap: 9 (ref 5–15)
BUN: 17 mg/dL (ref 6–20)
CO2: 23 mmol/L (ref 22–32)
Calcium: 8.7 mg/dL — ABNORMAL LOW (ref 8.9–10.3)
Chloride: 105 mmol/L (ref 98–111)
Creatinine, Ser: 1.05 mg/dL (ref 0.61–1.24)
GFR, Estimated: >60 mL/min (ref >60)
Glucose, Bld: 90 mg/dL (ref 70–99)
Potassium: 3.7 mmol/L (ref 3.5–5.1)
Sodium: 137 mmol/L (ref 135–145)

## 2023-12-23 LAB — CBC
HCT: 43.5 % (ref 39.0–52.0)
Hemoglobin: 14.2 g/dL (ref 13.0–17.0)
MCH: 31.3 pg (ref 26.0–34.0)
MCHC: 32.6 g/dL (ref 30.0–36.0)
MCV: 96 fL (ref 80.0–100.0)
Platelets: 311 K/uL (ref 150–400)
RBC: 4.53 MIL/uL (ref 4.22–5.81)
RDW: 13.7 % (ref 11.5–15.5)
WBC: 6.5 K/uL (ref 4.0–10.5)
nRBC: 0 % (ref 0.0–0.2)

## 2023-12-23 LAB — TROPONIN I (HIGH SENSITIVITY)
Troponin I (High Sensitivity): 4 ng/L (ref <18)
Troponin I (High Sensitivity): 5 ng/L (ref <18)

## 2023-12-23 LAB — D-DIMER, QUANTITATIVE: D-Dimer, Quant: 0.32 ug{FEU}/mL (ref 0.00–0.50)

## 2023-12-23 NOTE — Discharge Instructions (Signed)
 You were seen in the ED today for chest pain and shortness of breath. While you were here we did physical exam, labs, EKG and chest x-ray.  Your workup was reassuring and showed no evidence of heart attack or blood clot.  Please follow-up with cardiology as scheduled for your stress test and echocardiogram.  Please come back to emergency department if you have worsening chest pain, shortness of breath, pain with deep breathing or if you have any other reason to believe you need emergency care

## 2023-12-23 NOTE — ED Triage Notes (Signed)
 Pt came invia POV d/t SOB that's worse with exertion & lightheadedness for the past hr prior to arrival to ED. Endorses central CP as well, no radiation of pain.
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
## 2024-01-24 ENCOUNTER — Encounter (HOSPITAL_COMMUNITY): Payer: Self-pay

## 2024-01-27 ENCOUNTER — Other Ambulatory Visit: Payer: Self-pay | Admitting: Physician Assistant

## 2024-01-27 DIAGNOSIS — R079 Chest pain, unspecified: Secondary | ICD-10-CM

## 2024-01-27 DIAGNOSIS — Z8249 Family history of ischemic heart disease and other diseases of the circulatory system: Secondary | ICD-10-CM

## 2024-01-27 DIAGNOSIS — R0602 Shortness of breath: Secondary | ICD-10-CM

## 2024-01-30 ENCOUNTER — Other Ambulatory Visit: Payer: Self-pay | Admitting: Physician Assistant

## 2024-01-30 DIAGNOSIS — R072 Precordial pain: Secondary | ICD-10-CM

## 2024-01-30 DIAGNOSIS — R079 Chest pain, unspecified: Secondary | ICD-10-CM

## 2024-01-30 DIAGNOSIS — Z8249 Family history of ischemic heart disease and other diseases of the circulatory system: Secondary | ICD-10-CM

## 2024-01-30 DIAGNOSIS — R0602 Shortness of breath: Secondary | ICD-10-CM

## 2024-02-03 ENCOUNTER — Ambulatory Visit (HOSPITAL_COMMUNITY)
Admission: RE | Admit: 2024-02-03 | Discharge: 2024-02-03 | Disposition: A | Discharge status: Home / Self Care | Source: Ambulatory Visit | Attending: Physician Assistant | Admitting: Physician Assistant

## 2024-02-03 DIAGNOSIS — R0602 Shortness of breath: Secondary | ICD-10-CM | POA: Insufficient documentation

## 2024-02-03 DIAGNOSIS — Z8249 Family history of ischemic heart disease and other diseases of the circulatory system: Secondary | ICD-10-CM

## 2024-02-03 DIAGNOSIS — R079 Chest pain, unspecified: Secondary | ICD-10-CM

## 2024-02-03 LAB — MYOCARDIAL PERFUSION IMAGING
Angina Index: 0
Duke Treadmill Score: 11
Estimated workload: 12.5
Exercise duration (min): 10 min
Exercise duration (sec): 30 s
LV dias vol: 115 mL (ref 62–150)
LV sys vol: 43 mL (ref 4.2–5.8)
MPHR: 169 {beats}/min
Nuc Stress EF: 63 %
Peak HR: 150 {beats}/min
Percent HR: 88 %
Rest HR: 72 {beats}/min
Rest Nuclear Isotope Dose: 10.5 mCi
SDS: 6
SRS: 2
SSS: 8
ST Depression (mm): 0 mm
TID: 0.88

## 2024-02-03 LAB — ECHOCARDIOGRAM COMPLETE
Area-P 1/2: 2.32 cm2
S' Lateral: 3.4 cm

## 2024-02-03 MED ORDER — TECHNETIUM TC 99M TETROFOSMIN IV KIT
31.0000 | PACK | Freq: Once | INTRAVENOUS | Status: AC | PRN
Start: 2024-02-03 — End: 2024-02-03
  Administered 2024-02-03: 31 via INTRAVENOUS

## 2024-02-03 MED ORDER — TECHNETIUM TC 99M TETROFOSMIN IV KIT
10.5000 | PACK | Freq: Once | INTRAVENOUS | Status: AC | PRN
  Administered 2024-02-03: 10.5 via INTRAVENOUS

## 2024-02-06 ENCOUNTER — Ambulatory Visit: Payer: Self-pay | Admitting: Physician Assistant

## 2024-02-07 NOTE — Telephone Encounter (Signed)
 Spoke with pt regarding Stress test results. Pt verbalized understanding. Pt was advised to d/c his Imdur  and f/u with his PCP. Pt agreed. Pt stated he had some slight chest pain, but he believes lack of sleep is the cause. Pt was adivse to call 911 or go to the nearest ED if symptoms get worse. Pt agreed. Pt was advise to call our office if he has any further questions or concerns.

## 2024-02-07 NOTE — Telephone Encounter (Signed)
 Spoke with pt regarding his Echo results. Pt verbalized understanding and had no further questions. Pt thanked me for the call.
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
## 2024-02-10 ENCOUNTER — Ambulatory Visit: Attending: General Practice | Admitting: Emergency Medicine

## 2024-02-10 ENCOUNTER — Encounter: Payer: Self-pay | Admitting: General Practice

## 2024-02-10 VITALS — BP 120/78 | HR 78 | Ht 75.0 in | Wt 205.0 lb

## 2024-02-10 DIAGNOSIS — R072 Precordial pain: Secondary | ICD-10-CM

## 2024-02-10 DIAGNOSIS — R0602 Shortness of breath: Secondary | ICD-10-CM | POA: Diagnosis not present

## 2024-02-10 NOTE — Patient Instructions (Signed)
 Medication Instructions:  PLEASE FOLLOW UP WITH YOUR PCP TO DISCUSS ACID REFLUX WORK UP Your physician recommends that you continue on your current medications as directed. Please refer to the Current Medication list given to you today. *If you need a refill on your cardiac medications before your next appointment, please call your pharmacy*  Lab Work: TODAY-LIPIDS If you have labs (blood work) drawn today and your tests are completely normal, you will receive your results only by: MyChart Message (if you have MyChart) OR A paper copy in the mail If you have any lab test that is abnormal or we need to change your treatment, we will call you to review the results.  Testing/Procedures: NONE ORDERED  Follow-Up: At [facility], you and your health needs are our priority.  As part of our continuing mission to provide you with exceptional heart care, our providers are all part of one team.  This team includes your primary Cardiologist (physician) and Advanced Practice Providers or APPs (Physician Assistants and Nurse Practitioners) who all work together to provide you with the care you need, when you need it.  Your next appointment:   6 month(s)  Provider:   Lonni Cash, MD    We recommend signing up for the patient portal called MyChart.  Sign up information is provided on this After Visit Summary.  MyChart is used to connect with patients for Virtual Visits (Telemedicine).  Patients are able to view lab/test results, encounter notes, upcoming appointments, etc.  Non-urgent messages can be sent to your provider as well.   To learn more about what you can do with MyChart, go to [email or web address].   Other Instructions

## 2024-02-11 LAB — LIPID PANEL
Chol/HDL Ratio: 2.5 ratio (ref 0.0–5.0)
Cholesterol, Total: 206 mg/dL — ABNORMAL HIGH (ref 100–199)
HDL: 82 mg/dL (ref >39)
LDL Chol Calc (NIH): 111 mg/dL — ABNORMAL HIGH (ref 0–99)
Triglycerides: 75 mg/dL (ref 0–149)
VLDL Cholesterol Cal: 13 mg/dL (ref 5–40)

## 2024-02-13 ENCOUNTER — Ambulatory Visit: Payer: Self-pay | Admitting: Emergency Medicine

## 2024-02-13 NOTE — Telephone Encounter (Signed)
Spoke with pt regarding lab results. Pt verbalized understanding and had no further questions.

## 2024-04-26 ENCOUNTER — Encounter: Payer: Self-pay | Admitting: Emergency Medicine

## 2024-04-26 ENCOUNTER — Ambulatory Visit
Admission: EM | Admit: 2024-04-26 | Discharge: 2024-04-26 | Disposition: A | Discharge status: Home / Self Care | Source: Home / Self Care

## 2024-04-26 DIAGNOSIS — H6122 Impacted cerumen, left ear: Secondary | ICD-10-CM | POA: Diagnosis not present
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
## 2024-04-26 NOTE — ED Triage Notes (Signed)
 Pt presents c/o L otalgia x 14 days. Pt states,  My left ear been clogged up for like 2 weeks. I done everything I can to get it out.

## 2024-05-17 ENCOUNTER — Ambulatory Visit: Admitting: Family Medicine

## 2024-05-17 ENCOUNTER — Encounter: Payer: Self-pay | Admitting: Family Medicine

## 2024-05-17 VITALS — BP 120/84 | HR 77 | Temp 98.2°F | Ht 75.0 in | Wt 207.0 lb

## 2024-05-17 DIAGNOSIS — E782 Mixed hyperlipidemia: Secondary | ICD-10-CM | POA: Diagnosis not present

## 2024-05-17 DIAGNOSIS — R351 Nocturia: Secondary | ICD-10-CM | POA: Diagnosis not present

## 2024-05-17 DIAGNOSIS — Z114 Encounter for screening for human immunodeficiency virus [HIV]: Secondary | ICD-10-CM | POA: Diagnosis not present

## 2024-05-17 DIAGNOSIS — Z0000 Encounter for general adult medical examination without abnormal findings: Secondary | ICD-10-CM

## 2024-05-17 DIAGNOSIS — R202 Paresthesia of skin: Secondary | ICD-10-CM | POA: Diagnosis not present

## 2024-05-17 DIAGNOSIS — R7309 Other abnormal glucose: Secondary | ICD-10-CM

## 2024-05-17 DIAGNOSIS — Z7689 Persons encountering health services in other specified circumstances: Secondary | ICD-10-CM
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
## 2024-05-18 LAB — CBC WITH DIFFERENTIAL/PLATELET
Basophils Absolute: 0 x10E3/uL (ref 0.0–0.2)
Basos: 1 %
EOS (ABSOLUTE): 0.1 x10E3/uL (ref 0.0–0.4)
Eos: 1 %
Hematocrit: 44.7 % (ref 37.5–51.0)
Hemoglobin: 15 g/dL (ref 13.0–17.7)
Immature Grans (Abs): 0 x10E3/uL (ref 0.0–0.1)
Immature Granulocytes: 0 %
Lymphocytes Absolute: 1.8 x10E3/uL (ref 0.7–3.1)
Lymphs: 22 %
MCH: 32.1 pg (ref 26.6–33.0)
MCHC: 33.6 g/dL (ref 31.5–35.7)
MCV: 96 fL (ref 79–97)
Monocytes Absolute: 0.9 x10E3/uL (ref 0.1–0.9)
Monocytes: 11 %
Neutrophils Absolute: 5.6 x10E3/uL (ref 1.4–7.0)
Neutrophils: 65 %
Platelets: 308 x10E3/uL (ref 150–450)
RBC: 4.67 x10E6/uL (ref 4.14–5.80)
RDW: 13.1 % (ref 11.6–15.4)
WBC: 8.5 x10E3/uL (ref 3.4–10.8)

## 2024-05-18 LAB — HIV ANTIBODY (ROUTINE TESTING W REFLEX): HIV Screen 4th Generation wRfx: NONREACTIVE

## 2024-05-18 LAB — BASIC METABOLIC PANEL WITH GFR
BUN/Creatinine Ratio: 15 (ref 9–20)
BUN: 16 mg/dL (ref 6–24)
CO2: 22 mmol/L (ref 20–29)
Calcium: 9.3 mg/dL (ref 8.7–10.2)
Chloride: 99 mmol/L (ref 96–106)
Creatinine, Ser: 1.08 mg/dL (ref 0.76–1.27)
Glucose: 88 mg/dL (ref 70–99)
Potassium: 4.5 mmol/L (ref 3.5–5.2)
Sodium: 137 mmol/L (ref 134–144)
eGFR: 83 mL/min/1.73

## 2024-05-18 LAB — LIPID PANEL
Chol/HDL Ratio: 2.6 ratio (ref 0.0–5.0)
Cholesterol, Total: 195 mg/dL (ref 100–199)
HDL: 74 mg/dL
LDL Chol Calc (NIH): 99 mg/dL (ref 0–99)
Triglycerides: 128 mg/dL (ref 0–149)
VLDL Cholesterol Cal: 22 mg/dL (ref 5–40)

## 2024-05-18 LAB — HEMOGLOBIN A1C
Est. average glucose Bld gHb Est-mCnc: 117 mg/dL
Hgb A1c MFr Bld: 5.7 % — ABNORMAL HIGH (ref 4.8–5.6)

## 2024-05-18 LAB — PSA TOTAL (REFLEX TO FREE): Prostate Specific Ag, Serum: 0.7 ng/mL (ref 0.0–4.0)

## 2024-05-23 DIAGNOSIS — R202 Paresthesia of skin: Secondary | ICD-10-CM | POA: Insufficient documentation

## 2024-05-23 NOTE — Assessment & Plan Note (Signed)
 Slightly elevated cholesterol levels, no medication per cardiologist. - Advised dietary modifications to reduce intake of fried and fatty foods. - Will recheck cholesterol levels.

## 2024-05-23 NOTE — Assessment & Plan Note (Signed)
-   Ordered PSA test for prostate cancer screening.

## 2024-05-23 NOTE — Assessment & Plan Note (Addendum)
 intermittent tingling in arms, sees orthopedic already

## 2024-05-23 NOTE — Assessment & Plan Note (Signed)
 Previous conflicting reports of prediabetes, no symptoms or diagnosis. - Ordered blood test to evaluate glucose levels.

## 2024-05-24 ENCOUNTER — Ambulatory Visit: Payer: Self-pay | Admitting: Family Medicine

## 2024-05-24 MED ORDER — MELOXICAM 15 MG PO TABS: 15.0000 mg | ORAL_TABLET | Freq: Every day | ORAL | 0 refills | Status: AC | PRN

## 2024-05-24 NOTE — Progress Notes (Signed)
 Kdney function, electrolytes, blood count are normal. Cholesterol levels have improved. PSA levels are normal, so no indication of prostate cancer.   Your A1c is 5.7, this is prediabetes, so low carbohydates diet advised.  Thanks!
